# Patient Record
Sex: Male | Born: 1950 | Race: White | Hispanic: No | Marital: Married | Smoking: Current every day smoker
Health system: Southern US, Community
[De-identification: ages and names within clinical notes are randomized; demographics above are authoritative.]

## PROBLEM LIST (undated history)

## (undated) DIAGNOSIS — I10 Essential (primary) hypertension: Secondary | ICD-10-CM

## (undated) DIAGNOSIS — F419 Anxiety disorder, unspecified: Secondary | ICD-10-CM

## (undated) HISTORY — DX: Anxiety disorder, unspecified: F41.9

## (undated) HISTORY — DX: Essential (primary) hypertension: I10

## (undated) HISTORY — PX: CERVICAL SPINE SURGERY: SHX589

---

## 2002-07-13 ENCOUNTER — Ambulatory Visit (HOSPITAL_COMMUNITY): Admission: RE | Admit: 2002-07-13 | Discharge: 2002-07-13 | Payer: Self-pay | Admitting: Internal Medicine

## 2003-10-18 ENCOUNTER — Ambulatory Visit (HOSPITAL_COMMUNITY): Admission: RE | Admit: 2003-10-18 | Discharge: 2003-10-18 | Payer: Self-pay | Admitting: Family Medicine

## 2006-01-21 ENCOUNTER — Ambulatory Visit: Payer: Self-pay | Admitting: Internal Medicine

## 2010-06-27 HISTORY — PX: SPINE SURGERY: SHX786

## 2014-01-25 ENCOUNTER — Encounter: Payer: Self-pay | Admitting: Family Medicine

## 2014-02-28 ENCOUNTER — Encounter: Payer: Self-pay | Admitting: Family Medicine

## 2014-02-28 ENCOUNTER — Ambulatory Visit (INDEPENDENT_AMBULATORY_CARE_PROVIDER_SITE_OTHER): Payer: PRIVATE HEALTH INSURANCE | Admitting: Family Medicine

## 2014-02-28 VITALS — BP 106/74 | HR 82 | Temp 98.5°F | Resp 14 | Ht 72.0 in | Wt 180.0 lb

## 2014-02-28 DIAGNOSIS — F411 Generalized anxiety disorder: Secondary | ICD-10-CM

## 2014-02-28 DIAGNOSIS — K621 Rectal polyp: Secondary | ICD-10-CM

## 2014-02-28 DIAGNOSIS — K620 Anal polyp: Secondary | ICD-10-CM

## 2014-02-28 DIAGNOSIS — Z0000 Encounter for general adult medical examination without abnormal findings: Secondary | ICD-10-CM

## 2014-02-28 DIAGNOSIS — Z79899 Other long term (current) drug therapy: Secondary | ICD-10-CM

## 2014-02-28 LAB — CBC WITH DIFFERENTIAL/PLATELET
BASOS ABS: 0 10*3/uL (ref 0.0–0.1)
BASOS PCT: 0 % (ref 0–1)
EOS PCT: 3 % (ref 0–5)
Eosinophils Absolute: 0.2 10*3/uL (ref 0.0–0.7)
HEMATOCRIT: 51.7 % (ref 39.0–52.0)
HEMOGLOBIN: 18.3 g/dL — AB (ref 13.0–17.0)
Lymphocytes Relative: 19 % (ref 12–46)
Lymphs Abs: 1.3 10*3/uL (ref 0.7–4.0)
MCH: 33.7 pg (ref 26.0–34.0)
MCHC: 35.4 g/dL (ref 30.0–36.0)
MCV: 95.2 fL (ref 78.0–100.0)
MONO ABS: 0.6 10*3/uL (ref 0.1–1.0)
MONOS PCT: 9 % (ref 3–12)
Neutro Abs: 4.8 10*3/uL (ref 1.7–7.7)
Neutrophils Relative %: 69 % (ref 43–77)
Platelets: 220 10*3/uL (ref 150–400)
RBC: 5.43 MIL/uL (ref 4.22–5.81)
RDW: 14 % (ref 11.5–15.5)
WBC: 7 10*3/uL (ref 4.0–10.5)

## 2014-02-28 LAB — LIPID PANEL
CHOLESTEROL: 118 mg/dL (ref 0–200)
HDL: 50 mg/dL (ref >39)
LDL Cholesterol: 33 mg/dL (ref 0–99)
TRIGLYCERIDES: 173 mg/dL — AB (ref <150)
Total CHOL/HDL Ratio: 2.4 Ratio
VLDL: 35 mg/dL (ref 0–40)

## 2014-02-28 LAB — COMPLETE METABOLIC PANEL WITH GFR
ALBUMIN: 4.3 g/dL (ref 3.5–5.2)
ALK PHOS: 65 U/L (ref 39–117)
ALT: 45 U/L (ref 0–53)
AST: 27 U/L (ref 0–37)
BUN: 22 mg/dL (ref 6–23)
CALCIUM: 10.2 mg/dL (ref 8.4–10.5)
CO2: 25 mEq/L (ref 19–32)
CREATININE: 1.4 mg/dL — AB (ref 0.50–1.35)
Chloride: 100 mEq/L (ref 96–112)
GFR, EST NON AFRICAN AMERICAN: 53 mL/min — AB
GFR, Est African American: 62 mL/min
GLUCOSE: 97 mg/dL (ref 70–99)
Potassium: 4.5 mEq/L (ref 3.5–5.3)
Sodium: 138 mEq/L (ref 135–145)
Total Bilirubin: 0.9 mg/dL (ref 0.2–1.2)
Total Protein: 7.2 g/dL (ref 6.0–8.3)

## 2014-02-28 LAB — TSH: TSH: 1.138 u[IU]/mL (ref 0.350–4.500)

## 2014-02-28 MED ORDER — ZOLPIDEM TARTRATE 10 MG PO TABS: 10.0000 mg | ORAL_TABLET | Freq: Every evening | ORAL | Status: AC | PRN

## 2014-02-28 MED ORDER — ESCITALOPRAM OXALATE 10 MG PO TABS
10.0000 mg | ORAL_TABLET | Freq: Every day | ORAL | Status: AC
Start: 2014-02-28 — End: ?

## 2014-02-28 NOTE — Progress Notes (Signed)
Subjective:    Patient ID: Christopher Barber, male    DOB: [birth year], 63 y.o.   MRN: [id number]  HPI Patient is here today to reestablish care and complete physical exam.  He does complain of generalized anxiety. He constantly feels anxious. This causes him to have a difficult time sleeping. Patient states that this causes him to have a difficult time quitting smoking because he smokes to help control his anxiety. Otherwise he is doing well. His last colonoscopy was in 2013. Past Medical History  Diagnosis Date  . Hypertension   . Anxiety    Past Surgical History  Procedure Laterality Date  . Spine surgery  06/27/2010    neck fx  . Cervical spine surgery     Current Outpatient Prescriptions on File Prior to Visit  Medication Sig Dispense Refill  . aspirin 81 MG tablet Take 81 mg by mouth daily.      Marland Kitchen lisinopril (PRINIVIL,ZESTRIL) 20 MG tablet Take 20 mg by mouth daily.       No current facility-administered medications on file prior to visit.   No Known Allergies History   Social History  . Marital Status: Married    Spouse Name: N/A    Number of Children: N/A  . Years of Education: N/A   Occupational History  . Not on file.   Social History Main Topics  . Smoking status: Current Every Day Smoker -- 0.50 packs/day    Types: Cigarettes  . Smokeless tobacco: Never Used  . Alcohol Use: Yes     Comment: 3 drinks a week  . Drug Use: No  . Sexual Activity: Not on file     Comment: Married to [place].  Retired.   Other Topics Concern  . Not on file   Social History Narrative  . No narrative on file      Review of Systems  All other systems reviewed and are negative.      Objective:   Physical Exam  Vitals reviewed. Constitutional: He is oriented to person, place, and time. He appears well-developed and well-nourished. No distress.  HENT:  Head: Normocephalic and atraumatic.  Right Ear: External ear normal.  Left Ear: External ear normal.  Nose: Nose  normal.  Mouth/Throat: Oropharynx is clear and moist. No oropharyngeal exudate.  Eyes: Conjunctivae and EOM are normal. Pupils are equal, round, and reactive to light. Right eye exhibits no discharge. Left eye exhibits no discharge. No scleral icterus.  Neck: Normal range of motion. Neck supple. No JVD present. No tracheal deviation present. No thyromegaly present.  Cardiovascular: Normal rate, regular rhythm, normal heart sounds and intact distal pulses.  Exam reveals no gallop and no friction rub.   No murmur heard. Pulmonary/Chest: Effort normal and breath sounds normal. No stridor. No respiratory distress. He has no wheezes. He has no rales. He exhibits no tenderness.  Abdominal: Soft. Bowel sounds are normal. He exhibits no distension and no mass. There is no tenderness. There is no rebound and no guarding.  Genitourinary: Prostate normal. Rectal exam shows mass.  Musculoskeletal: Normal range of motion. He exhibits no edema and no tenderness.  Lymphadenopathy:    He has no cervical adenopathy.  Neurological: He is alert and oriented to person, place, and time. He has normal reflexes. He displays normal reflexes. No cranial nerve deficit. He exhibits normal muscle tone. Coordination normal.  Skin: Skin is warm. No rash noted. He is not diaphoretic. No erythema. No pallor.  Psychiatric: He has a normal  mood and affect. His behavior is normal. Judgment and thought content normal.    On rectal exam there is a 1 cm polyp appreciated at 6:00 approximately 5 cm internally.    Assessment & Plan:  1. Routine general medical examination at a health care facility Physical exam today is completely normal. I recommended smoking cessation. I will check a CBC, CMP, fasting lipid panel, PSA, and TSH. Blood pressure is well controlled. Immunizations are up to date. - CBC with Differential - COMPLETE METABOLIC PANEL WITH GFR - Lipid panel - PSA - TSH - Ambulatory referral to Gastroenterology  2.  Encounter for long-term (current) use of other medications - CBC with Differential - COMPLETE METABOLIC PANEL WITH GFR - Lipid panel  3. GAD (generalized anxiety disorder) Begin Lexapro 10 mg by mouth daily. Recheck in one month. He can use Ambien 5 mg by mouth each bedtime when necessary for insomnia. - escitalopram (LEXAPRO) 10 MG tablet; Take 1 tablet (10 mg total) by mouth daily.  Dispense: 30 tablet; Refill: 3  4. Rectal polyp Scheduled patient for a GI consultation. I believe this is a small rectal polyp. Can't exclude malignancy.

## 2014-03-01 ENCOUNTER — Encounter: Payer: Self-pay | Admitting: Family Medicine

## 2014-03-01 LAB — PSA: PSA: 2.87 ng/mL (ref <=4.00)

## 2014-03-08 ENCOUNTER — Telehealth: Payer: Self-pay

## 2014-03-08 NOTE — Telephone Encounter (Signed)
LMOM for a return call.  

## 2014-04-07 NOTE — Telephone Encounter (Signed)
PT has not responded to letter that was sent or phone call to schedule colonoscopy.

## 2015-05-27 ENCOUNTER — Inpatient Hospital Stay (HOSPITAL_COMMUNITY)
Admission: EM | Admit: 2015-05-27 | Discharge: 2015-06-06 | DRG: 208 | Disposition: E | Payer: Non-veteran care | Attending: Internal Medicine | Admitting: Internal Medicine

## 2015-05-27 ENCOUNTER — Encounter (HOSPITAL_COMMUNITY): Payer: Self-pay

## 2015-05-27 ENCOUNTER — Emergency Department (HOSPITAL_COMMUNITY): Payer: Non-veteran care

## 2015-05-27 DIAGNOSIS — J9600 Acute respiratory failure, unspecified whether with hypoxia or hypercapnia: Secondary | ICD-10-CM | POA: Insufficient documentation

## 2015-05-27 DIAGNOSIS — F17210 Nicotine dependence, cigarettes, uncomplicated: Secondary | ICD-10-CM | POA: Diagnosis present

## 2015-05-27 DIAGNOSIS — Z79899 Other long term (current) drug therapy: Secondary | ICD-10-CM

## 2015-05-27 DIAGNOSIS — F419 Anxiety disorder, unspecified: Secondary | ICD-10-CM | POA: Diagnosis present

## 2015-05-27 DIAGNOSIS — R739 Hyperglycemia, unspecified: Secondary | ICD-10-CM | POA: Diagnosis present

## 2015-05-27 DIAGNOSIS — Z66 Do not resuscitate: Secondary | ICD-10-CM | POA: Diagnosis not present

## 2015-05-27 DIAGNOSIS — I469 Cardiac arrest, cause unspecified: Secondary | ICD-10-CM | POA: Diagnosis not present

## 2015-05-27 DIAGNOSIS — J449 Chronic obstructive pulmonary disease, unspecified: Secondary | ICD-10-CM | POA: Diagnosis present

## 2015-05-27 DIAGNOSIS — Z515 Encounter for palliative care: Secondary | ICD-10-CM

## 2015-05-27 DIAGNOSIS — I10 Essential (primary) hypertension: Secondary | ICD-10-CM | POA: Diagnosis present

## 2015-05-27 DIAGNOSIS — G253 Myoclonus: Secondary | ICD-10-CM | POA: Diagnosis present

## 2015-05-27 DIAGNOSIS — K2901 Acute gastritis with bleeding: Secondary | ICD-10-CM | POA: Diagnosis present

## 2015-05-27 DIAGNOSIS — E874 Mixed disorder of acid-base balance: Secondary | ICD-10-CM | POA: Diagnosis present

## 2015-05-27 DIAGNOSIS — K2970 Gastritis, unspecified, without bleeding: Secondary | ICD-10-CM | POA: Diagnosis present

## 2015-05-27 DIAGNOSIS — I480 Paroxysmal atrial fibrillation: Secondary | ICD-10-CM | POA: Diagnosis present

## 2015-05-27 DIAGNOSIS — Z8249 Family history of ischemic heart disease and other diseases of the circulatory system: Secondary | ICD-10-CM

## 2015-05-27 DIAGNOSIS — N179 Acute kidney failure, unspecified: Secondary | ICD-10-CM | POA: Diagnosis present

## 2015-05-27 DIAGNOSIS — G92 Toxic encephalopathy: Secondary | ICD-10-CM | POA: Diagnosis present

## 2015-05-27 DIAGNOSIS — F1010 Alcohol abuse, uncomplicated: Secondary | ICD-10-CM | POA: Diagnosis present

## 2015-05-27 DIAGNOSIS — W1830XA Fall on same level, unspecified, initial encounter: Secondary | ICD-10-CM | POA: Diagnosis present

## 2015-05-27 DIAGNOSIS — R000 Tachycardia, unspecified: Secondary | ICD-10-CM | POA: Diagnosis present

## 2015-05-27 DIAGNOSIS — J9601 Acute respiratory failure with hypoxia: Secondary | ICD-10-CM

## 2015-05-27 DIAGNOSIS — K709 Alcoholic liver disease, unspecified: Secondary | ICD-10-CM | POA: Diagnosis present

## 2015-05-27 DIAGNOSIS — Z09 Encounter for follow-up examination after completed treatment for conditions other than malignant neoplasm: Secondary | ICD-10-CM

## 2015-05-27 DIAGNOSIS — Y908 Blood alcohol level of 240 mg/100 ml or more: Secondary | ICD-10-CM | POA: Diagnosis present

## 2015-05-27 DIAGNOSIS — R402432 Glasgow coma scale score 3-8, at arrival to emergency department: Secondary | ICD-10-CM | POA: Diagnosis present

## 2015-05-27 DIAGNOSIS — R197 Diarrhea, unspecified: Secondary | ICD-10-CM | POA: Diagnosis present

## 2015-05-27 DIAGNOSIS — R579 Shock, unspecified: Secondary | ICD-10-CM | POA: Diagnosis present

## 2015-05-27 DIAGNOSIS — D696 Thrombocytopenia, unspecified: Secondary | ICD-10-CM | POA: Diagnosis present

## 2015-05-27 DIAGNOSIS — R680 Hypothermia, not associated with low environmental temperature: Secondary | ICD-10-CM | POA: Diagnosis present

## 2015-05-27 DIAGNOSIS — S0101XA Laceration without foreign body of scalp, initial encounter: Secondary | ICD-10-CM | POA: Diagnosis present

## 2015-05-27 DIAGNOSIS — R740 Nonspecific elevation of levels of transaminase and lactic acid dehydrogenase [LDH]: Secondary | ICD-10-CM | POA: Diagnosis present

## 2015-05-27 DIAGNOSIS — F10129 Alcohol abuse with intoxication, unspecified: Secondary | ICD-10-CM | POA: Diagnosis present

## 2015-05-27 DIAGNOSIS — J9602 Acute respiratory failure with hypercapnia: Secondary | ICD-10-CM | POA: Diagnosis not present

## 2015-05-27 DIAGNOSIS — E878 Other disorders of electrolyte and fluid balance, not elsewhere classified: Secondary | ICD-10-CM | POA: Diagnosis present

## 2015-05-27 DIAGNOSIS — G931 Anoxic brain damage, not elsewhere classified: Secondary | ICD-10-CM | POA: Diagnosis present

## 2015-05-27 DIAGNOSIS — K7200 Acute and subacute hepatic failure without coma: Secondary | ICD-10-CM | POA: Diagnosis present

## 2015-05-27 DIAGNOSIS — Z7982 Long term (current) use of aspirin: Secondary | ICD-10-CM

## 2015-05-27 LAB — CBC
HCT: 41.6 % (ref 39.0–52.0)
Hemoglobin: 13.2 g/dL (ref 13.0–17.0)
MCH: 32.3 pg (ref 26.0–34.0)
MCHC: 31.7 g/dL (ref 30.0–36.0)
MCV: 101.7 fL — AB (ref 78.0–100.0)
PLATELETS: 244 10*3/uL (ref 150–400)
RBC: 4.09 MIL/uL — AB (ref 4.22–5.81)
RDW: 13.9 % (ref 11.5–15.5)
WBC: 9.8 10*3/uL (ref 4.0–10.5)

## 2015-05-27 LAB — I-STAT CG4 LACTIC ACID, ED: Lactic Acid, Venous: 9.31 mmol/L (ref 0.5–2.0)

## 2015-05-27 LAB — I-STAT TROPONIN, ED: Troponin i, poc: 0.01 ng/mL (ref 0.00–0.08)

## 2015-05-27 MED ORDER — SUCCINYLCHOLINE CHLORIDE 20 MG/ML IJ SOLN
INTRAMUSCULAR | Status: AC
Start: 2015-05-27 — End: 2015-05-28
  Filled 2015-05-27: qty 1

## 2015-05-27 MED ORDER — SODIUM CHLORIDE 0.9 % IV SOLN
INTRAVENOUS | Status: AC | PRN
  Administered 2015-05-27 (×2): 1000 mL via INTRAVENOUS

## 2015-05-27 MED ORDER — ROCURONIUM BROMIDE 50 MG/5ML IV SOLN
INTRAVENOUS | Status: AC
  Filled 2015-05-27: qty 2

## 2015-05-27 MED ORDER — LIDOCAINE HCL (CARDIAC) 20 MG/ML IV SOLN
INTRAVENOUS | Status: AC
  Filled 2015-05-27: qty 5

## 2015-05-27 MED ORDER — ETOMIDATE 2 MG/ML IV SOLN
INTRAVENOUS | Status: AC | PRN
  Administered 2015-05-27: 20 mg via INTRAVENOUS

## 2015-05-27 MED ORDER — ETOMIDATE 2 MG/ML IV SOLN
INTRAVENOUS | Status: AC
  Filled 2015-05-27: qty 20

## 2015-05-27 MED ORDER — EPINEPHRINE HCL 0.1 MG/ML IJ SOSY
PREFILLED_SYRINGE | INTRAMUSCULAR | Status: AC | PRN
  Administered 2015-05-27: 1 mg via INTRAVENOUS

## 2015-05-27 MED ORDER — ROCURONIUM BROMIDE 50 MG/5ML IV SOLN
INTRAVENOUS | Status: AC | PRN
  Administered 2015-05-27: 50 mg via INTRAVENOUS

## 2015-05-27 NOTE — ED Notes (Signed)
Pt placed on bare hugger with sheet over top due to Dr. Magdalene Mollyrder and pts temp 33.4C.

## 2015-05-27 NOTE — Code Documentation (Signed)
Pt has yellow colored necklace with cross pendant removed and placed in specimen cup.
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
## 2015-05-27 NOTE — ED Notes (Signed)
Pt returned from CT with this RN at this time.  

## 2015-05-28 ENCOUNTER — Inpatient Hospital Stay (HOSPITAL_COMMUNITY): Payer: Non-veteran care

## 2015-05-28 ENCOUNTER — Other Ambulatory Visit (HOSPITAL_COMMUNITY): Payer: Non-veteran care

## 2015-05-28 DIAGNOSIS — K2901 Acute gastritis with bleeding: Secondary | ICD-10-CM | POA: Diagnosis present

## 2015-05-28 DIAGNOSIS — R739 Hyperglycemia, unspecified: Secondary | ICD-10-CM | POA: Diagnosis present

## 2015-05-28 DIAGNOSIS — F1010 Alcohol abuse, uncomplicated: Secondary | ICD-10-CM | POA: Diagnosis present

## 2015-05-28 DIAGNOSIS — G253 Myoclonus: Secondary | ICD-10-CM

## 2015-05-28 DIAGNOSIS — Z515 Encounter for palliative care: Secondary | ICD-10-CM | POA: Diagnosis not present

## 2015-05-28 DIAGNOSIS — K7200 Acute and subacute hepatic failure without coma: Secondary | ICD-10-CM | POA: Diagnosis present

## 2015-05-28 DIAGNOSIS — D696 Thrombocytopenia, unspecified: Secondary | ICD-10-CM | POA: Diagnosis present

## 2015-05-28 DIAGNOSIS — J9602 Acute respiratory failure with hypercapnia: Secondary | ICD-10-CM | POA: Diagnosis present

## 2015-05-28 DIAGNOSIS — J449 Chronic obstructive pulmonary disease, unspecified: Secondary | ICD-10-CM | POA: Diagnosis present

## 2015-05-28 DIAGNOSIS — R000 Tachycardia, unspecified: Secondary | ICD-10-CM | POA: Diagnosis present

## 2015-05-28 DIAGNOSIS — I469 Cardiac arrest, cause unspecified: Secondary | ICD-10-CM

## 2015-05-28 DIAGNOSIS — R402432 Glasgow coma scale score 3-8, at arrival to emergency department: Secondary | ICD-10-CM | POA: Diagnosis present

## 2015-05-28 DIAGNOSIS — W1830XA Fall on same level, unspecified, initial encounter: Secondary | ICD-10-CM | POA: Diagnosis present

## 2015-05-28 DIAGNOSIS — G931 Anoxic brain damage, not elsewhere classified: Secondary | ICD-10-CM | POA: Diagnosis not present

## 2015-05-28 DIAGNOSIS — I10 Essential (primary) hypertension: Secondary | ICD-10-CM | POA: Diagnosis present

## 2015-05-28 DIAGNOSIS — R197 Diarrhea, unspecified: Secondary | ICD-10-CM | POA: Diagnosis present

## 2015-05-28 DIAGNOSIS — Z8249 Family history of ischemic heart disease and other diseases of the circulatory system: Secondary | ICD-10-CM | POA: Diagnosis not present

## 2015-05-28 DIAGNOSIS — R7989 Other specified abnormal findings of blood chemistry: Secondary | ICD-10-CM | POA: Diagnosis not present

## 2015-05-28 DIAGNOSIS — J9601 Acute respiratory failure with hypoxia: Secondary | ICD-10-CM | POA: Diagnosis not present

## 2015-05-28 DIAGNOSIS — J9600 Acute respiratory failure, unspecified whether with hypoxia or hypercapnia: Secondary | ICD-10-CM | POA: Diagnosis not present

## 2015-05-28 DIAGNOSIS — Z66 Do not resuscitate: Secondary | ICD-10-CM | POA: Diagnosis not present

## 2015-05-28 DIAGNOSIS — Z7982 Long term (current) use of aspirin: Secondary | ICD-10-CM | POA: Diagnosis not present

## 2015-05-28 DIAGNOSIS — F419 Anxiety disorder, unspecified: Secondary | ICD-10-CM | POA: Diagnosis present

## 2015-05-28 DIAGNOSIS — N179 Acute kidney failure, unspecified: Secondary | ICD-10-CM | POA: Diagnosis present

## 2015-05-28 DIAGNOSIS — K709 Alcoholic liver disease, unspecified: Secondary | ICD-10-CM | POA: Diagnosis present

## 2015-05-28 DIAGNOSIS — R579 Shock, unspecified: Secondary | ICD-10-CM | POA: Diagnosis present

## 2015-05-28 DIAGNOSIS — K2970 Gastritis, unspecified, without bleeding: Secondary | ICD-10-CM | POA: Diagnosis present

## 2015-05-28 DIAGNOSIS — G92 Toxic encephalopathy: Secondary | ICD-10-CM | POA: Diagnosis present

## 2015-05-28 DIAGNOSIS — S0101XA Laceration without foreign body of scalp, initial encounter: Secondary | ICD-10-CM | POA: Diagnosis present

## 2015-05-28 DIAGNOSIS — F17210 Nicotine dependence, cigarettes, uncomplicated: Secondary | ICD-10-CM | POA: Diagnosis present

## 2015-05-28 DIAGNOSIS — R740 Nonspecific elevation of levels of transaminase and lactic acid dehydrogenase [LDH]: Secondary | ICD-10-CM | POA: Diagnosis present

## 2015-05-28 DIAGNOSIS — E874 Mixed disorder of acid-base balance: Secondary | ICD-10-CM | POA: Diagnosis present

## 2015-05-28 DIAGNOSIS — F10129 Alcohol abuse with intoxication, unspecified: Secondary | ICD-10-CM | POA: Diagnosis present

## 2015-05-28 DIAGNOSIS — Z79899 Other long term (current) drug therapy: Secondary | ICD-10-CM | POA: Diagnosis not present

## 2015-05-28 DIAGNOSIS — E878 Other disorders of electrolyte and fluid balance, not elsewhere classified: Secondary | ICD-10-CM | POA: Diagnosis present

## 2015-05-28 DIAGNOSIS — I480 Paroxysmal atrial fibrillation: Secondary | ICD-10-CM | POA: Diagnosis present

## 2015-05-28 DIAGNOSIS — Y908 Blood alcohol level of 240 mg/100 ml or more: Secondary | ICD-10-CM | POA: Diagnosis present

## 2015-05-28 DIAGNOSIS — R680 Hypothermia, not associated with low environmental temperature: Secondary | ICD-10-CM | POA: Diagnosis present

## 2015-05-28 LAB — MAGNESIUM
MAGNESIUM: 2.7 mg/dL — AB (ref 1.7–2.4)
MAGNESIUM: 1.4 mg/dL — AB (ref 1.7–2.4)
Magnesium: 1.3 mg/dL — ABNORMAL LOW (ref 1.7–2.4)

## 2015-05-28 LAB — I-STAT ARTERIAL BLOOD GAS, ED
Acid-base deficit: 17 mmol/L — ABNORMAL HIGH (ref 0.0–2.0)
Acid-base deficit: 21 mmol/L — ABNORMAL HIGH (ref 0.0–2.0)
Bicarbonate: 12.2 meq/L — ABNORMAL LOW (ref 20.0–24.0)
Bicarbonate: 9.6 mEq/L — ABNORMAL LOW (ref 20.0–24.0)
O2 SAT: 100 %
O2 Saturation: 100 %
PH ART: 7.218 — AB (ref 7.350–7.450)
Patient temperature: 33.7
TCO2: 10 mmol/L (ref 0–100)
TCO2: 14 mmol/L (ref 0–100)
pCO2 arterial: 22.9 mmHg — ABNORMAL LOW (ref 35.0–45.0)
pCO2 arterial: 53.2 mmHg — ABNORMAL HIGH (ref 35.0–45.0)
pH, Arterial: 6.944 — CL (ref 7.350–7.450)
pO2, Arterial: 217 mmHg — ABNORMAL HIGH (ref 80.0–100.0)
pO2, Arterial: 581 mmHg — ABNORMAL HIGH (ref 80.0–100.0)

## 2015-05-28 LAB — GLUCOSE, CAPILLARY
GLUCOSE-CAPILLARY: 157 mg/dL — AB (ref 65–99)
Glucose-Capillary: 148 mg/dL — ABNORMAL HIGH (ref 65–99)
Glucose-Capillary: 119 mg/dL — ABNORMAL HIGH (ref 65–99)

## 2015-05-28 LAB — URINALYSIS, ROUTINE W REFLEX MICROSCOPIC
Bilirubin Urine: NEGATIVE
GLUCOSE, UA: NEGATIVE mg/dL
KETONES UR: NEGATIVE mg/dL
Nitrite: NEGATIVE
PROTEIN: 30 mg/dL — AB
Specific Gravity, Urine: 1.006 (ref 1.005–1.030)
Urobilinogen, UA: 0.2 mg/dL (ref 0.0–1.0)
pH: 5.5 (ref 5.0–8.0)

## 2015-05-28 LAB — CBC
HCT: 36.7 % — ABNORMAL LOW (ref 39.0–52.0)
HEMOGLOBIN: 12.3 g/dL — AB (ref 13.0–17.0)
MCH: 32.8 pg (ref 26.0–34.0)
MCHC: 33.5 g/dL (ref 30.0–36.0)
MCV: 97.9 fL (ref 78.0–100.0)
PLATELETS: 241 10*3/uL (ref 150–400)
RBC: 3.75 MIL/uL — AB (ref 4.22–5.81)
RDW: 13.9 % (ref 11.5–15.5)
WBC: 20.9 10*3/uL — AB (ref 4.0–10.5)

## 2015-05-28 LAB — COMPREHENSIVE METABOLIC PANEL
ALBUMIN: 3.5 g/dL (ref 3.5–5.0)
ALK PHOS: 74 U/L (ref 38–126)
ALT: 174 U/L — ABNORMAL HIGH (ref 17–63)
ANION GAP: 12 (ref 5–15)
AST: 309 U/L — ABNORMAL HIGH (ref 15–41)
BUN: 19 mg/dL (ref 6–20)
CALCIUM: 8.6 mg/dL — AB (ref 8.9–10.3)
CHLORIDE: 110 mmol/L (ref 101–111)
CO2: 19 mmol/L — AB (ref 22–32)
Creatinine, Ser: 1.44 mg/dL — ABNORMAL HIGH (ref 0.61–1.24)
GFR calc Af Amer: 58 mL/min — ABNORMAL LOW (ref >60)
GFR calc non Af Amer: 50 mL/min — ABNORMAL LOW (ref >60)
GLUCOSE: 166 mg/dL — AB (ref 65–99)
Potassium: 3.6 mmol/L (ref 3.5–5.1)
SODIUM: 141 mmol/L (ref 135–145)
Total Bilirubin: 0.4 mg/dL (ref 0.3–1.2)
Total Protein: 6 g/dL — ABNORMAL LOW (ref 6.5–8.1)

## 2015-05-28 LAB — POCT I-STAT 3, ART BLOOD GAS (G3+)
ACID-BASE DEFICIT: 8 mmol/L — AB (ref 0.0–2.0)
Acid-base deficit: 12 mmol/L — ABNORMAL HIGH (ref 0.0–2.0)
BICARBONATE: 15.3 meq/L — AB (ref 20.0–24.0)
Bicarbonate: 12.6 mEq/L — ABNORMAL LOW (ref 20.0–24.0)
O2 Saturation: 99 %
O2 Saturation: 100 %
PCO2 ART: 26.7 mmHg — AB (ref 35.0–45.0)
PH ART: 7.284 — AB (ref 7.350–7.450)
PO2 ART: 154 mmHg — AB (ref 80.0–100.0)
PO2 ART: 190 mmHg — AB (ref 80.0–100.0)
Patient temperature: 99
TCO2: 13 mmol/L (ref 0–100)
TCO2: 16 mmol/L (ref 0–100)
pCO2 arterial: 26.3 mmHg — ABNORMAL LOW (ref 35.0–45.0)
pH, Arterial: 7.376 (ref 7.350–7.450)

## 2015-05-28 LAB — URINE MICROSCOPIC-ADD ON

## 2015-05-28 LAB — I-STAT CHEM 8, ED
BUN: 22 mg/dL — ABNORMAL HIGH (ref 6–20)
Calcium, Ion: 1.06 mmol/L — ABNORMAL LOW (ref 1.13–1.30)
Chloride: 111 mmol/L (ref 101–111)
Creatinine, Ser: 1.5 mg/dL — ABNORMAL HIGH (ref 0.61–1.24)
Glucose, Bld: 107 mg/dL — ABNORMAL HIGH (ref 65–99)
HCT: 48 % (ref 39.0–52.0)
Hemoglobin: 16.3 g/dL (ref 13.0–17.0)
Potassium: 3 mmol/L — ABNORMAL LOW (ref 3.5–5.1)
Sodium: 146 mmol/L — ABNORMAL HIGH (ref 135–145)
TCO2: 13 mmol/L (ref 0–100)

## 2015-05-28 LAB — BLOOD GAS, ARTERIAL
Acid-base deficit: 18.9 mmol/L — ABNORMAL HIGH (ref 0.0–2.0)
BICARBONATE: 7.9 meq/L — AB (ref 20.0–24.0)
DRAWN BY: 29017
FIO2: 0.4
MECHVT: 620 mL
O2 Saturation: 97 %
PATIENT TEMPERATURE: 98.6
PCO2 ART: 22.2 mmHg — AB (ref 35.0–45.0)
PEEP: 5 cmH2O
PO2 ART: 119 mmHg — AB (ref 80.0–100.0)
RATE: 24 resp/min
TCO2: 8.6 mmol/L (ref 0–100)
pH, Arterial: 7.179 — CL (ref 7.350–7.450)

## 2015-05-28 LAB — PROTIME-INR
INR: 1.28 (ref 0.00–1.49)
PROTHROMBIN TIME: 16.2 s — AB (ref 11.6–15.2)

## 2015-05-28 LAB — ETHANOL: ALCOHOL ETHYL (B): 413 mg/dL — AB (ref <5)

## 2015-05-28 LAB — BASIC METABOLIC PANEL
ANION GAP: 18 — AB (ref 5–15)
ANION GAP: 9 (ref 5–15)
BUN: 20 mg/dL (ref 6–20)
BUN: 24 mg/dL — ABNORMAL HIGH (ref 6–20)
CALCIUM: 6.3 mg/dL — AB (ref 8.9–10.3)
CHLORIDE: 116 mmol/L — AB (ref 101–111)
CO2: 9 mmol/L — AB (ref 22–32)
CO2: 17 mmol/L — ABNORMAL LOW (ref 22–32)
Calcium: 6.9 mg/dL — ABNORMAL LOW (ref 8.9–10.3)
Chloride: 112 mmol/L — ABNORMAL HIGH (ref 101–111)
Creatinine, Ser: 1.36 mg/dL — ABNORMAL HIGH (ref 0.61–1.24)
Creatinine, Ser: 1.78 mg/dL — ABNORMAL HIGH (ref 0.61–1.24)
GFR calc Af Amer: >60 mL/min (ref >60)
GFR calc Af Amer: 45 mL/min — ABNORMAL LOW (ref >60)
GFR calc non Af Amer: 54 mL/min — ABNORMAL LOW (ref >60)
GFR, EST NON AFRICAN AMERICAN: 39 mL/min — AB (ref >60)
GLUCOSE: 179 mg/dL — AB (ref 65–99)
Glucose, Bld: 163 mg/dL — ABNORMAL HIGH (ref 65–99)
POTASSIUM: 3.9 mmol/L (ref 3.5–5.1)
Potassium: 4.3 mmol/L (ref 3.5–5.1)
SODIUM: 142 mmol/L (ref 135–145)
Sodium: 139 mmol/L (ref 135–145)

## 2015-05-28 LAB — RAPID URINE DRUG SCREEN, HOSP PERFORMED
AMPHETAMINES: NOT DETECTED
BARBITURATES: NOT DETECTED
BENZODIAZEPINES: NOT DETECTED
COCAINE: NOT DETECTED
Opiates: NOT DETECTED
TETRAHYDROCANNABINOL: NOT DETECTED

## 2015-05-28 LAB — TROPONIN I
TROPONIN I: 0.06 ng/mL — AB (ref <0.031)
TROPONIN I: 0.1 ng/mL — AB (ref <0.031)
Troponin I: 0.07 ng/mL — ABNORMAL HIGH (ref <0.031)

## 2015-05-28 LAB — LACTIC ACID, PLASMA
LACTIC ACID, VENOUS: 7.2 mmol/L — AB (ref 0.5–2.0)
LACTIC ACID, VENOUS: 5.1 mmol/L — AB (ref 0.5–2.0)
Lactic Acid, Venous: 5.6 mmol/L (ref 0.5–2.0)

## 2015-05-28 LAB — MRSA PCR SCREENING: MRSA by PCR: NEGATIVE

## 2015-05-28 LAB — OCCULT BLOOD X 1 CARD TO LAB, STOOL: FECAL OCCULT BLD: POSITIVE — AB

## 2015-05-28 LAB — PHOSPHORUS: Phosphorus: 4.3 mg/dL (ref 2.5–4.6)

## 2015-05-28 MED ORDER — SODIUM CHLORIDE 0.9 % IV SOLN
INTRAVENOUS | Status: DC
  Administered 2015-05-28 – 2015-05-29 (×2): via INTRAVENOUS

## 2015-05-28 MED ORDER — SODIUM CHLORIDE 0.9 % IV SOLN
2.0000 g | Freq: Once | INTRAVENOUS | Status: AC
  Administered 2015-05-28: 2 g via INTRAVENOUS
  Filled 2015-05-28: qty 20

## 2015-05-28 MED ORDER — SODIUM CHLORIDE 0.9 % IV BOLUS (SEPSIS)
1000.0000 mL | Freq: Once | INTRAVENOUS | Status: AC
  Administered 2015-05-28: 1000 mL via INTRAVENOUS

## 2015-05-28 MED ORDER — ONDANSETRON HCL 4 MG/2ML IJ SOLN: 4.0000 mg | Freq: Four times a day (QID) | INTRAMUSCULAR | Status: DC | PRN

## 2015-05-28 MED ORDER — NOREPINEPHRINE BITARTRATE 1 MG/ML IV SOLN
0.0000 ug/min | Freq: Once | INTRAVENOUS | Status: AC
  Administered 2015-05-28: 15 ug/min via INTRAVENOUS
  Filled 2015-05-28: qty 4

## 2015-05-28 MED ORDER — SODIUM CHLORIDE 0.9 % IV SOLN
500.0000 mg | Freq: Two times a day (BID) | INTRAVENOUS | Status: DC
  Administered 2015-05-28: 500 mg via INTRAVENOUS
  Filled 2015-05-28 (×2): qty 5

## 2015-05-28 MED ORDER — ACETAMINOPHEN 325 MG PO TABS: 650.0000 mg | ORAL_TABLET | ORAL | Status: DC | PRN

## 2015-05-28 MED ORDER — NOREPINEPHRINE BITARTRATE 1 MG/ML IV SOLN
0.0000 ug/min | Freq: Once | INTRAVENOUS | Status: AC
  Administered 2015-05-28: 40 ug/min via INTRAVENOUS
  Filled 2015-05-28: qty 4

## 2015-05-28 MED ORDER — INFLUENZA VAC SPLIT QUAD 0.5 ML IM SUSY
0.5000 mL | PREFILLED_SYRINGE | INTRAMUSCULAR | Status: AC
  Administered 2015-05-29: 0.5 mL via INTRAMUSCULAR
  Filled 2015-05-28: qty 0.5

## 2015-05-28 MED ORDER — SODIUM CHLORIDE 0.9 % IV SOLN
1000.0000 mg | Freq: Two times a day (BID) | INTRAVENOUS | Status: DC
  Administered 2015-05-28 – 2015-05-31 (×6): 1000 mg via INTRAVENOUS
  Filled 2015-05-28 (×7): qty 10

## 2015-05-28 MED ORDER — MAGNESIUM SULFATE 2 GM/50ML IV SOLN
2.0000 g | Freq: Once | INTRAVENOUS | Status: AC
  Administered 2015-05-28: 2 g via INTRAVENOUS
  Filled 2015-05-28: qty 50

## 2015-05-28 MED ORDER — SODIUM CHLORIDE 0.9 % IV SOLN
0.0000 ug/h | INTRAVENOUS | Status: DC
  Administered 2015-05-28: 25 ug/h via INTRAVENOUS
  Filled 2015-05-28: qty 50

## 2015-05-28 MED ORDER — CHLORHEXIDINE GLUCONATE 0.12% ORAL RINSE (MEDLINE KIT)
15.0000 mL | Freq: Two times a day (BID) | OROMUCOSAL | Status: DC
  Administered 2015-05-28 – 2015-05-31 (×7): 15 mL via OROMUCOSAL

## 2015-05-28 MED ORDER — SODIUM CHLORIDE 0.9 % IV BOLUS (SEPSIS)
2000.0000 mL | Freq: Once | INTRAVENOUS | Status: AC
  Administered 2015-05-28: 2000 mL via INTRAVENOUS

## 2015-05-28 MED ORDER — PANTOPRAZOLE SODIUM 40 MG PO PACK
40.0000 mg | PACK | Freq: Every day | ORAL | Status: DC
  Administered 2015-05-28 – 2015-05-30 (×3): 40 mg
  Filled 2015-05-28 (×2): qty 20

## 2015-05-28 MED ORDER — FAMOTIDINE IN NACL 20-0.9 MG/50ML-% IV SOLN
20.0000 mg | Freq: Two times a day (BID) | INTRAVENOUS | Status: DC
  Administered 2015-05-28 – 2015-05-31 (×7): 20 mg via INTRAVENOUS
  Filled 2015-05-28 (×7): qty 50

## 2015-05-28 MED ORDER — SODIUM BICARBONATE 8.4 % IV SOLN
50.0000 meq | Freq: Once | INTRAVENOUS | Status: AC
  Administered 2015-05-28: 50 meq via INTRAVENOUS

## 2015-05-28 MED ORDER — LACTATED RINGERS IV SOLN
INTRAVENOUS | Status: DC
  Administered 2015-05-28: 05:00:00 via INTRAVENOUS

## 2015-05-28 MED ORDER — ENOXAPARIN SODIUM 40 MG/0.4ML ~~LOC~~ SOLN
40.0000 mg | SUBCUTANEOUS | Status: DC
  Administered 2015-05-28: 40 mg via SUBCUTANEOUS
  Filled 2015-05-28: qty 0.4

## 2015-05-28 MED ORDER — INSULIN ASPART 100 UNIT/ML ~~LOC~~ SOLN
0.0000 [IU] | SUBCUTANEOUS | Status: DC
  Administered 2015-05-28: 1 [IU] via SUBCUTANEOUS
  Administered 2015-05-28: 2 [IU] via SUBCUTANEOUS
  Administered 2015-05-29 (×3): 1 [IU] via SUBCUTANEOUS

## 2015-05-28 MED ORDER — FENTANYL CITRATE (PF) 100 MCG/2ML IJ SOLN
25.0000 ug | INTRAMUSCULAR | Status: DC | PRN
  Administered 2015-05-28: 100 ug via INTRAVENOUS
  Filled 2015-05-28: qty 2

## 2015-05-28 MED ORDER — ASPIRIN 300 MG RE SUPP
300.0000 mg | RECTAL | Status: AC
  Administered 2015-05-28: 300 mg via RECTAL
  Filled 2015-05-28: qty 1

## 2015-05-28 MED ORDER — ASPIRIN 81 MG PO CHEW: 324.0000 mg | CHEWABLE_TABLET | ORAL | Status: AC

## 2015-05-28 MED ORDER — SODIUM CHLORIDE 0.9 % IV SOLN: 250.0000 mL | INTRAVENOUS | Status: DC | PRN

## 2015-05-28 MED ORDER — ALBUTEROL SULFATE (2.5 MG/3ML) 0.083% IN NEBU: 2.5000 mg | INHALATION_SOLUTION | RESPIRATORY_TRACT | Status: DC | PRN

## 2015-05-28 MED ORDER — NOREPINEPHRINE BITARTRATE 1 MG/ML IV SOLN
2.0000 ug/min | INTRAVENOUS | Status: DC
  Administered 2015-05-28: 40 ug/min via INTRAVENOUS
  Administered 2015-05-30: 10 ug/min via INTRAVENOUS
  Administered 2015-05-30: 2 ug/min via INTRAVENOUS
  Filled 2015-05-28 (×5): qty 16

## 2015-05-28 MED ORDER — SODIUM BICARBONATE 8.4 % IV SOLN
100.0000 meq | Freq: Once | INTRAVENOUS | Status: AC
  Administered 2015-05-28: 100 meq via INTRAVENOUS
  Filled 2015-05-28: qty 50

## 2015-05-28 MED ORDER — VALPROATE SODIUM 500 MG/5ML IV SOLN
500.0000 mg | Freq: Three times a day (TID) | INTRAVENOUS | Status: DC
  Administered 2015-05-28 – 2015-05-31 (×8): 500 mg via INTRAVENOUS
  Filled 2015-05-28 (×11): qty 5

## 2015-05-28 MED ORDER — VALPROATE SODIUM 500 MG/5ML IV SOLN
15.0000 mg/kg | Freq: Once | INTRAVENOUS | Status: AC
  Administered 2015-05-28: 1247 mg via INTRAVENOUS
  Filled 2015-05-28: qty 12.47

## 2015-05-28 MED ORDER — LORAZEPAM 2 MG/ML IJ SOLN
1.0000 mg | INTRAMUSCULAR | Status: DC | PRN
Start: 2015-05-28 — End: 2015-05-31
  Administered 2015-05-28 – 2015-05-29 (×3): 2 mg via INTRAVENOUS
  Filled 2015-05-28 (×3): qty 1

## 2015-05-28 MED ORDER — ANTISEPTIC ORAL RINSE SOLUTION (CORINZ)
7.0000 mL | OROMUCOSAL | Status: DC
  Administered 2015-05-28 – 2015-05-31 (×31): 7 mL via OROMUCOSAL

## 2015-05-28 NOTE — Progress Notes (Signed)
Paged MD regarding low urine output, orders received.
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
## 2015-05-28 NOTE — ED Notes (Signed)
Family at bedside. Dr. Celene KrasBrooten in to speak with family.

## 2015-05-28 NOTE — Progress Notes (Signed)
Dr. Marchelle Gearingamaswamy notified of patient with intermittent rhythmic eye movements, suspicious for seizure activity. Order given and placed for Keppra IVPB. Continuing to monitor patient at this time.

## 2015-05-28 NOTE — Progress Notes (Signed)
eLink Physician-Brief Progress Note Patient Name: Katina DegreeMichael A Boldon DOB: [birth year] MRN: [id number]   Date of Service  05/28/2015  HPI/Events of Note  Multiple issues: 1. Ca++ = 6.3 and albumin = 3.5. ABG = 7.179/22/119/   eICU Interventions  Will order: 1. Replete Ca++ 2. NaHCO3 100 meq IV now. 3. ABG and lactic acid level at 7 AM.      Intervention Category Major Interventions: Acid-Base disturbance - evaluation and management Intermediate Interventions: Electrolyte abnormality - evaluation and management  Inayah Woodin Eugene 05/28/2015, 5:49 AM
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
## 2015-05-28 NOTE — ED Notes (Signed)
This Clinical research associatewriter to assist in room during placement of Central and Arterial line by Dr. Celene KrasBrooten.

## 2015-05-28 NOTE — Progress Notes (Signed)
Patient seen. Unresponsive on vent. In NSR without arrhythmia. BP stable. Ecg and Echo pending today. Mild troponin elevation is consistent with CPR. Otherwise per Dr. Skipper ClicheBalfour's note I agree that this was not a primary event. Will follow up on cardiac studies tomorrow.   Christopher Urbanek [place] MD, [facility]

## 2015-05-28 NOTE — Consult Note (Signed)
Cardiology Consult Note Jenna Luo TOM, MD Referring provider: Dr. Audie Pinto, ED  Reason for consult: s/p PEA arrest, witnessed head trauma  History of Present Illness (and review of medical records): NYLEN CREQUE is a 64 y.o. male with hx of HTN, anxiety, tobacco and likely alcohol abuse who presents to ED after witnessed head trauma and arrest.  He was at home with his wife who reportedly he had been drinking heavily (approx 20 bottles of alcohol).  He stood up to go inside house and fell backwards hitting his head.  Patient was not breathing and unarousable per his wife.  She called EMS and was instructed to initiate CPR. Upon EMS arrival pt did not have spontaneous respirations. After airway placed, pt was hypotensive and in afib with RVR.  He reported lost pulses on route. Had ROSC after 3 epi's and returned to [facility].  He remains with low normal BP and SR in ED.  Initial pertinent labs in ED are Cr 1.44, LFTs elevated, Etoh 413, lactic acid 9.3, ph 6.94 on ABG.  CT head with soft tissue laceration along posterior neck.  Review of Systems Review of systems not obtained due to patient factors.  There are no active problems to display for this patient.  Past Medical History  Diagnosis Date  . Hypertension   . Anxiety     Past Surgical History  Procedure Laterality Date  . Spine surgery  06/27/2010    neck fx  . Cervical spine surgery     Current Facility-Administered Medications  Medication Dose Route Frequency Provider Last Rate Last Dose  . lidocaine (cardiac) 100 mg/3m (XYLOCAINE) 20 MG/ML injection 2%           . norepinephrine (LEVOPHED) 4 mg in dextrose 5 % 250 mL (0.016 mg/mL) infusion  0-40 mcg/min Intravenous Once JHoyle Sauer MD      . sodium bicarbonate injection 50 mEq  50 mEq Intravenous Once JHoyle Sauer MD      . succinylcholine (ANECTINE) 20 MG/ML injection            Current Outpatient Prescriptions  Medication Sig Dispense Refill  . aspirin 81 MG tablet  Take 81 mg by mouth daily.    .Marland Kitchenlisinopril (PRINIVIL,ZESTRIL) 20 MG tablet Take 20 mg by mouth daily.    .Marland Kitchenescitalopram (LEXAPRO) 10 MG tablet Take 1 tablet (10 mg total) by mouth daily. (Patient not taking: Reported on 05/28/2015) 30 tablet 3  . zolpidem (AMBIEN) 10 MG tablet Take 1 tablet (10 mg total) by mouth at bedtime as needed for sleep. 15 tablet 1     No Known Allergies  Social History  Substance Use Topics  . Smoking status: Current Every Day Smoker -- 0.50 packs/day    Types: Cigarettes  . Smokeless tobacco: Never Used  . Alcohol Use: Yes     Comment: 3 drinks a week    Family History  Problem Relation Age of Onset  . Heart disease Mother      Objective:  Patient Vitals for the past 8 hrs:  BP Temp Pulse Resp SpO2 Weight  05/28/15 0030 104/57 mmHg (!) 91 F (32.8 C) 88 22 100 % -  05/28/15 0015 (!) 64/44 mmHg (!) 91.2 F (32.9 C) 92 22 100 % -  05/28/15 0012 (!) 59/37 mmHg (!) 91.2 F (32.9 C) 92 22 100 % -  05/28/15 0011 (!) 61/43 mmHg (!) 91.2 F (32.9 C) 92 22 100 % -  05/28/15 0000 (!) 85/60 mmHg (Marland Kitchen  91.6 F (33.1 C) 95 16 100 % -  05/09/2015 2345 106/72 mmHg (!) 92.7 F (33.7 C) 94 18 100 % -  05/24/2015 2317 101/64 mmHg (!) 93.4 F (34.1 C) 79 17 100 % -  05/12/2015 2315 92/59 mmHg (!) 93 F (33.9 C) 83 16 100 % -  05/30/2015 2309 128/68 mmHg - 105 16 100 % -  05/12/2015 2308 128/68 mmHg - (!) 122 16 100 % -  05/09/2015 2257 (!) 71/38 mmHg - - 19 99 % -  05/10/2015 2250 130/70 mmHg - (!) 140 14 100 % 70 kg (154 lb 5.2 oz)  05/16/2015 2244 130/70 mmHg - (!) 140 - - -   General appearance: covered with bear hugger in trauma C HEENT: ETT Neck: cervical collar Lungs: clear to auscultation bilaterally Heart: regular rate and rhythm,  Abdomen: soft, non-distended;  Extremities: no edema Pulses: 2+ Neurologic: sedated  Results for orders placed or performed during the hospital encounter of 05/09/2015 (from the past 48 hour(s))  Urinalysis, Routine w reflex microscopic  (not at [facility])     Status: Abnormal   Collection Time: 05/14/2015 10:15 PM  Result Value Ref Range   Color, Urine YELLOW YELLOW   APPearance CLOUDY (A) CLEAR   Specific Gravity, Urine 1.006 1.005 - 1.030   pH 5.5 5.0 - 8.0   Glucose, UA NEGATIVE NEGATIVE mg/dL   Hgb urine dipstick MODERATE (A) NEGATIVE   Bilirubin Urine NEGATIVE NEGATIVE   Ketones, ur NEGATIVE NEGATIVE mg/dL   Protein, ur 30 (A) NEGATIVE mg/dL   Urobilinogen, UA 0.2 0.0 - 1.0 mg/dL   Nitrite NEGATIVE NEGATIVE   Leukocytes, UA TRACE (A) NEGATIVE  Urine rapid drug screen (hosp performed)     Status: None   Collection Time: 05/09/2015 10:15 PM  Result Value Ref Range   Opiates NONE DETECTED NONE DETECTED   Cocaine NONE DETECTED NONE DETECTED   Benzodiazepines NONE DETECTED NONE DETECTED   Amphetamines NONE DETECTED NONE DETECTED   Tetrahydrocannabinol NONE DETECTED NONE DETECTED   Barbiturates NONE DETECTED NONE DETECTED    Comment:        DRUG SCREEN FOR MEDICAL PURPOSES ONLY.  IF CONFIRMATION IS NEEDED FOR ANY PURPOSE, NOTIFY LAB WITHIN 5 DAYS.        LOWEST DETECTABLE LIMITS FOR URINE DRUG SCREEN Drug Class       Cutoff (ng/mL) Amphetamine      1000 Barbiturate      200 Benzodiazepine   660 Tricyclics       630 Opiates          300 Cocaine          300 THC              50   Urine microscopic-add on     Status: Abnormal   Collection Time: 06/01/2015 10:15 PM  Result Value Ref Range   Squamous Epithelial / LPF FEW (A) RARE   WBC, UA 0-2 <3 WBC/hpf   RBC / HPF 7-10 <3 RBC/hpf   Bacteria, UA FEW (A) RARE   Urine-Other MUCOUS PRESENT     Comment: AMORPHOUS URATES/PHOSPHATES  Ethanol     Status: Abnormal   Collection Time: 05/13/2015 10:45 PM  Result Value Ref Range   Alcohol, Ethyl (B) 413 (HH) <5 mg/dL    Comment:        LOWEST DETECTABLE LIMIT FOR SERUM ALCOHOL IS 5 mg/dL FOR MEDICAL PURPOSES ONLY CRITICAL RESULT CALLED TO, READ BACK BY AND VERIFIED WITH: Romie Levee  RN 06/04/2015 0007 JORDANS   CBC      Status: Abnormal   Collection Time: 05/08/2015 10:45 PM  Result Value Ref Range   WBC 9.8 4.0 - 10.5 K/uL   RBC 4.09 (L) 4.22 - 5.81 MIL/uL   Hemoglobin 13.2 13.0 - 17.0 g/dL   HCT 41.6 39.0 - 52.0 %   MCV 101.7 (H) 78.0 - 100.0 fL   MCH 32.3 26.0 - 34.0 pg   MCHC 31.7 30.0 - 36.0 g/dL   RDW 13.9 11.5 - 15.5 %   Platelets 244 150 - 400 K/uL  Comprehensive metabolic panel     Status: Abnormal   Collection Time: 05/09/2015 10:45 PM  Result Value Ref Range   Sodium 141 135 - 145 mmol/L   Potassium 3.6 3.5 - 5.1 mmol/L   Chloride 110 101 - 111 mmol/L   CO2 19 (L) 22 - 32 mmol/L   Glucose, Bld 166 (H) 65 - 99 mg/dL   BUN 19 6 - 20 mg/dL   Creatinine, Ser 1.44 (H) 0.61 - 1.24 mg/dL   Calcium 8.6 (L) 8.9 - 10.3 mg/dL   Total Protein 6.0 (L) 6.5 - 8.1 g/dL   Albumin 3.5 3.5 - 5.0 g/dL   AST 309 (H) 15 - 41 U/L   ALT 174 (H) 17 - 63 U/L   Alkaline Phosphatase 74 38 - 126 U/L   Total Bilirubin 0.4 0.3 - 1.2 mg/dL   GFR calc non Af Amer 50 (L) >60 mL/min   GFR calc Af Amer 58 (L) >60 mL/min    Comment: (NOTE) The eGFR has been calculated using the CKD EPI equation. This calculation has not been validated in all clinical situations. eGFR's persistently <60 mL/min signify possible Chronic Kidney Disease.    Anion gap 12 5 - 15  Magnesium     Status: Abnormal   Collection Time: 06/04/2015 10:45 PM  Result Value Ref Range   Magnesium 2.7 (H) 1.7 - 2.4 mg/dL  I-Stat Troponin, ED (not at [facility])     Status: None   Collection Time: 06/01/2015 11:02 PM  Result Value Ref Range   Troponin i, poc 0.01 0.00 - 0.08 ng/mL   Comment 3            Comment: Due to the release kinetics of cTnI, a negative result within the first hours of the onset of symptoms does not rule out myocardial infarction with certainty. If myocardial infarction is still suspected, repeat the test at appropriate intervals.   I-Stat CG4 Lactic Acid, ED     Status: Abnormal   Collection Time: 05/28/2015 11:04 PM  Result Value  Ref Range   Lactic Acid, Venous 9.31 (HH) 0.5 - 2.0 mmol/L   Comment NOTIFIED PHYSICIAN   I-Stat Arterial Blood Gas, ED - (order at [facility] and [facility] only)     Status: Abnormal   Collection Time: 05/13/2015 11:57 PM  Result Value Ref Range   pH, Arterial 6.944 (LL) 7.350 - 7.450   pCO2 arterial 53.2 (H) 35.0 - 45.0 mmHg   pO2, Arterial 581.0 (H) 80.0 - 100.0 mmHg   Bicarbonate 12.2 (L) 20.0 - 24.0 mEq/L   TCO2 14 0 - 100 mmol/L   O2 Saturation 100.0 %   Acid-base deficit 21.0 (H) 0.0 - 2.0 mmol/L   Patient temperature 33.7 C    Collection site RADIAL, ALLEN'S TEST ACCEPTABLE    Drawn by RT    Sample type ARTERIAL    Comment NOTIFIED PHYSICIAN  I-Stat Chem 8, ED     Status: Abnormal   Collection Time: 05/28/15 12:04 AM  Result Value Ref Range   Sodium 146 (H) 135 - 145 mmol/L   Potassium 3.0 (L) 3.5 - 5.1 mmol/L   Chloride 111 101 - 111 mmol/L   BUN 22 (H) 6 - 20 mg/dL   Creatinine, Ser 1.50 (H) 0.61 - 1.24 mg/dL   Glucose, Bld 107 (H) 65 - 99 mg/dL   Calcium, Ion 1.06 (L) 1.13 - 1.30 mmol/L   TCO2 13 0 - 100 mmol/L   Hemoglobin 16.3 13.0 - 17.0 g/dL   HCT 48.0 39.0 - 52.0 %   Ct Head Wo Contrast  05/30/2015  CLINICAL DATA:  Patient found unresponsive. Laceration to the back of the head. Initial encounter. EXAM: CT HEAD WITHOUT CONTRAST CT CERVICAL SPINE WITHOUT CONTRAST TECHNIQUE: Multidetector CT imaging of the head and cervical spine was performed following the standard protocol without intravenous contrast. Multiplanar CT image reconstructions of the cervical spine were also generated. COMPARISON:  None. FINDINGS: CT HEAD FINDINGS There is no evidence of acute infarction, mass lesion, or intra- or extra-axial hemorrhage on CT. Mild periventricular white matter change likely reflects small vessel ischemic microangiopathy. The posterior fossa, including the cerebellum, brainstem and fourth ventricle, is within normal limits. The third and lateral ventricles, and basal ganglia are  unremarkable in appearance. The cerebral hemispheres are symmetric in appearance, with normal gray-white differentiation. No mass effect or midline shift is seen. There is no evidence of fracture; visualized osseous structures are unremarkable in appearance. The visualized portions of the orbits are within normal limits. There is minimal partial opacification of the right maxillary sinus. The remaining paranasal sinuses and mastoid air cells are well-aerated. No significant soft tissue abnormalities are seen. CT CERVICAL SPINE FINDINGS There is no evidence of fracture or subluxation. The patient is status post anterior cervical spinal fusion at C5-C7, with mild underlying degenerative change. Vertebral bodies demonstrate normal height and alignment. Prevertebral soft tissues are not well assessed due to the patient's endotracheal tube. The thyroid gland is unremarkable in appearance. The visualized lung apices are clear. A soft tissue laceration is noted along the posterior upper neck, with associated soft tissue air. IMPRESSION: 1. No evidence of traumatic intracranial injury or fracture. 2. No evidence of fracture or subluxation along the cervical spine. 3. Soft tissue laceration along the posterior upper neck, with associated soft tissue air. 4. Mild small vessel ischemic microangiopathy. 5. Minimal partial opacification of the right maxillary sinus. 6. Status post anterior cervical spinal fusion at C5-C7. Electronically Signed   By: Garald Balding M.D.   On: 05/12/2015 23:53   Ct Cervical Spine Wo Contrast  06/02/2015  CLINICAL DATA:  Patient found unresponsive. Laceration to the back of the head. Initial encounter. EXAM: CT HEAD WITHOUT CONTRAST CT CERVICAL SPINE WITHOUT CONTRAST TECHNIQUE: Multidetector CT imaging of the head and cervical spine was performed following the standard protocol without intravenous contrast. Multiplanar CT image reconstructions of the cervical spine were also generated.  COMPARISON:  None. FINDINGS: CT HEAD FINDINGS There is no evidence of acute infarction, mass lesion, or intra- or extra-axial hemorrhage on CT. Mild periventricular white matter change likely reflects small vessel ischemic microangiopathy. The posterior fossa, including the cerebellum, brainstem and fourth ventricle, is within normal limits. The third and lateral ventricles, and basal ganglia are unremarkable in appearance. The cerebral hemispheres are symmetric in appearance, with normal gray-white differentiation. No mass effect or midline shift is seen.  There is no evidence of fracture; visualized osseous structures are unremarkable in appearance. The visualized portions of the orbits are within normal limits. There is minimal partial opacification of the right maxillary sinus. The remaining paranasal sinuses and mastoid air cells are well-aerated. No significant soft tissue abnormalities are seen. CT CERVICAL SPINE FINDINGS There is no evidence of fracture or subluxation. The patient is status post anterior cervical spinal fusion at C5-C7, with mild underlying degenerative change. Vertebral bodies demonstrate normal height and alignment. Prevertebral soft tissues are not well assessed due to the patient's endotracheal tube. The thyroid gland is unremarkable in appearance. The visualized lung apices are clear. A soft tissue laceration is noted along the posterior upper neck, with associated soft tissue air. IMPRESSION: 1. No evidence of traumatic intracranial injury or fracture. 2. No evidence of fracture or subluxation along the cervical spine. 3. Soft tissue laceration along the posterior upper neck, with associated soft tissue air. 4. Mild small vessel ischemic microangiopathy. 5. Minimal partial opacification of the right maxillary sinus. 6. Status post anterior cervical spinal fusion at C5-C7. Electronically Signed   By: Garald Balding M.D.   On: 05/08/2015 23:53   Dg Chest Portable 1 View  05/17/2015   CLINICAL DATA:  Intubation. EXAM: PORTABLE CHEST 1 VIEW COMPARISON:  Earlier this evening at 2310 hour. FINDINGS: Endotracheal tube is approximately 4 cm from the carina. Enteric tube in place, tip below the diaphragm not included in the field of view. Lung volumes are low. Left costophrenic angle is excluded from the field of view. There is bibasilar atelectasis. Cardiomediastinal contours are unchanged. No pneumothorax. No acute osseous abnormalities are seen. Artifact projects over the left shoulder. IMPRESSION: 1. Endotracheal tube 4 cm from the carina.  Enteric tube in place. 2. Low lung volumes with bibasilar atelectasis. Electronically Signed   By: Jeb Levering M.D.   On: 05/11/2015 23:49   Dg Chest Portable 1 View  05/22/2015  CLINICAL DATA:  64 year old male with respiratory distress. Intubated. EXAM: PORTABLE CHEST 1 VIEW COMPARISON:  10/18/2003 FINDINGS: The cardiomediastinal silhouette is unremarkable. An endotracheal tube is present with tip 2 cm above the carina. An NG tube is present coiled in the proximal stomach. Mild elevation of right hemidiaphragm is again noted. There is no evidence of focal airspace disease, pulmonary edema, suspicious pulmonary nodule/mass, pleural effusion, or pneumothorax. No acute bony abnormalities are identified. IMPRESSION: Support apparatus as described. No evidence of acute cardiopulmonary disease. Electronically Signed   By: Margarette Canada M.D.   On: 05/08/2015 23:43    ECG:  HR 118 atrial fibrillation, non specific IVCD,.  Now SR HR in 80s-90s on monitor.  Impression: PEA arrest Respiratory arrest Acute hypercarbic respiratory failure Lactic acidosis Ground level fall with Head trauma Paroxysmal atrial fibrillation Alcohol intoxication Elevated LFTs CKD  Recommendations: -Given history from patient wife who witnessed event, likely had respiratory arrest 2/2 head trauma after GLF due to alcohol intoxication.  Do not suspect cardiac etiology to  events.  No signs on EKG to suggest acute coronary syndrome requiring urgent and/or invasive ischemic evaluation. -Agree with volume resuscitation and treatment of acidosis to decreased likelihood further cardiac events. -Consider cycle enzymes -TTE in am assess LV function post arrest -Monitor on telemetry, EKG prn -Mech Vent as per CCM -Discussed with ED physician. -Discussed with family wife and son at bedside.  Thank you for this consult.  We will follow along with primary team and make further recommendations as indicated.

## 2015-05-28 NOTE — Progress Notes (Signed)
eLink Physician-Brief Progress Note Patient Name: Christopher DegreeMichael A Barber DOB: 1951-05-14 MRN: [id number]   Date of Service  05/28/2015  HPI/Events of Note  Mg++ = 1.3 and Creatinine = 1.36.  eICU Interventions  Will replete Mg++.     Intervention Category Intermediate Interventions: Electrolyte abnormality - evaluation and management  [name] 05/28/2015, 6:36 AM

## 2015-05-28 NOTE — Progress Notes (Signed)
eLink Physician-Brief Progress Note Patient Name: Christopher DegreeMichael A Graef DOB: 06/14/1951 MRN: [id number]   Date of Service  05/28/2015  HPI/Events of Note  Ventilator dyssynchrony.  eICU Interventions  Will order Fentanyl IV bolus and IV infusion. Titrate to RASS 0 to -1.     Intervention Category Minor Interventions: Agitation / anxiety - evaluation and management  Christopher Barber 05/28/2015, 4:40 AM

## 2015-05-28 NOTE — Progress Notes (Signed)
CRITICAL VALUE ALERT  Critical value received:  Calcium of 6.3  Date of notification:  05/28/15  Time of notification:  0545  Critical value read back:yes  Nurse who received alert:  Lily Peerhomas Kimla Furth  MD notified (1st page):  Loletha GrayerSteven Sommer  Time MD responded: [phone]

## 2015-05-28 NOTE — Progress Notes (Signed)
eLink Physician-Brief Progress Note Patient Name: Christopher Barber DOB: 1950/08/29 MRN: [id number]   Date of Service  05/28/2015  HPI/Events of Note  Multiple issues: 1. Need order for Norepinephrine IV infusion that the patient is on and 2. CVP = 5-6.   eICU Interventions  Will order: 1. Norepinephrine IV infusion. Titrate to MAP >= 65.  2. Bolus with 0.9 NaCl 1 liter IV over 1 hour now.     Intervention Category Major Interventions: Hypotension - evaluation and management;Hypovolemia - evaluation and treatment with fluids  Sommer,Steven Eugene 05/28/2015, 4:29 AM

## 2015-05-28 NOTE — ED Provider Notes (Signed)
CSN: [id number]     Arrival date & time 05/15/2015  2241 History   First MD Initiated Contact with Patient 05/29/2015 2311     Chief Complaint  Patient presents with  . Cardiac Arrest   HISTORY, ROS, AND PHYSICAL EXAM LIMITED BY CRITICAL CONDITION OF PATIENT  (Consider location/radiation/quality/duration/timing/severity/associated sxs/prior Treatment) Patient is a 64 y.o. male presenting with syncope. The history is provided by the EMS personnel.  Loss of Consciousness Episode history:  Single Most recent episode:  Today Timing:  Constant Progression:  Unchanged Chronicity:  New Context comment:  Cardiac arrest, ETOH intoxication, Fall Witnessed: yes   Associated symptoms: shortness of breath   Associated symptoms comment:  Cardiac arrest, respiratory arrest, unresponsiveness  Risk factors comment:  ETOH abuse   Past Medical History  Diagnosis Date  . Hypertension   . Anxiety    Past Surgical History  Procedure Laterality Date  . Spine surgery  06/27/2010    neck fx  . Cervical spine surgery     Family History  Problem Relation Age of Onset  . Heart disease Mother    Social History  Substance Use Topics  . Smoking status: Current Every Day Smoker -- 0.50 packs/day    Types: Cigarettes  . Smokeless tobacco: Never Used  . Alcohol Use: Yes     Comment: 3 drinks a week    Review of Systems  Unable to perform ROS: Patient unresponsive  Respiratory: Positive for shortness of breath.   Cardiovascular: Positive for syncope.      Allergies  Review of patient's allergies indicates no known allergies.  Home Medications   Prior to Admission medications   Medication Sig Start Date End Date Taking? Authorizing Provider  aspirin 81 MG tablet Take 81 mg by mouth daily.   Yes Historical Provider, MD  lisinopril (PRINIVIL,ZESTRIL) 20 MG tablet Take 20 mg by mouth daily.   Yes Historical Provider, MD  escitalopram (LEXAPRO) 10 MG tablet Take 1 tablet (10 mg total) by mouth  daily. Patient not taking: Reported on 05/28/2015 02/28/14   Donita Brooks, MD  zolpidem (AMBIEN) 10 MG tablet Take 1 tablet (10 mg total) by mouth at bedtime as needed for sleep. 02/28/14 03/30/14  Donita Brooks, MD   BP 59/37 mmHg  Pulse 92  Temp(Src) 91.2 F (32.9 C)  Resp 22  Wt 154 lb 5.2 oz (70 kg)  SpO2 100% Physical Exam  Constitutional: He appears well-developed and well-nourished. He appears distressed.  HENT:  Head: Normocephalic.  Eyes: Right eye exhibits no discharge. Left eye exhibits no discharge. No scleral icterus. Right pupil is not reactive. Left pupil is not reactive.  Neck: No JVD present. No tracheal deviation present. No thyromegaly present.  Cardiovascular: An irregularly irregular rhythm present. Tachycardia present.  Exam reveals decreased pulses.   Pulses:      Carotid pulses are 2+ on the right side, and 2+ on the left side.      Femoral pulses are 2+ on the right side, and 2+ on the left side. Pulmonary/Chest: Apnea noted. He is in respiratory distress.  Abdominal: He exhibits distension.  Lymphadenopathy:    He has no cervical adenopathy.  Neurological: He is unresponsive. He displays no tremor. He displays no seizure activity. GCS eye subscore is 1. GCS verbal subscore is 1. GCS motor subscore is 1.  Skin: He is diaphoretic.    ED Course  .Intubation Date/Time: 05/23/2015 10:55 PM Performed by: Gavin Pound Authorized by: Nelva Nay Consent: The  procedure was performed in an emergent situation. Required items: required blood products, implants, devices, and special equipment available Patient identity confirmed: provided demographic data Indications: respiratory failure Intubation method: video-assisted Patient status: unconscious Preoxygenation: BVM Sedatives: etomidate Paralytic: rocuronium Laryngoscope size: Glidescope D. Tube size: 7.5 mm Tube type: cuffed Number of attempts: 2 Ventilation between attempts: BVM Cords  visualized: yes Post-procedure assessment: chest rise,  ETCO2 monitor and CO2 detector Breath sounds: equal and absent over the epigastrium Cuff inflated: yes ETT to lip: 26 cm Tube secured with: ETT holder Chest x-ray interpreted by radiologist and me. Chest x-ray findings: endotracheal tube too low Tube repositioned: tube repositioned successfully Patient tolerance: Patient tolerated the procedure well with no immediate complications  .Central Line Date/Time: 05/28/2015 1:00 AM Performed by: Gavin Pound Authorized by: Nelva Nay Consent: Written consent obtained. Risks and benefits: risks, benefits and alternatives were discussed Consent given by: spouse Patient understanding: patient states understanding of the procedure being performed Patient consent: the patient's understanding of the procedure matches consent given Procedure consent: procedure consent matches procedure scheduled Relevant documents: relevant documents present and verified Test results: test results available and properly labeled Site marked: the operative site was marked Imaging studies: imaging studies available Required items: required blood products, implants, devices, and special equipment available Patient identity confirmed: arm band Time out: Immediately prior to procedure a "time out" was called to verify the correct patient, procedure, equipment, support staff and site/side marked as required. Indications: vascular access Preparation: skin prepped with 2% chlorhexidine Skin prep agent dried: skin prep agent completely dried prior to procedure Sterile barriers: all five maximum sterile barriers used - cap, mask, sterile gown, sterile gloves, and large sterile sheet Hand hygiene: hand hygiene performed prior to central venous catheter insertion Location details: right subclavian Site selection rationale: Pt in C-collar Patient position: flat Catheter type: triple lumen Catheter size: 8.5  Fr Pre-procedure: landmarks identified Ultrasound guidance: yes Sterile ultrasound techniques: sterile gel and sterile probe covers were used Number of attempts: 2 Successful placement: yes Post-procedure: line sutured and dressing applied Assessment: blood return through all ports,  free fluid flow,  placement verified by x-ray and no pneumothorax on x-ray Patient tolerance: Patient tolerated the procedure well with no immediate complications  ARTERIAL LINE Date/Time: 05/28/2015 1:30 AM Performed by: Gavin Pound Authorized by: Nelva Nay Consent: Written consent obtained. Consent given by: spouse Patient understanding: patient states understanding of the procedure being performed Patient consent: the patient's understanding of the procedure matches consent given Procedure consent: procedure consent matches procedure scheduled Relevant documents: relevant documents present and verified Test results: test results available and properly labeled Site marked: the operative site was marked Imaging studies: imaging studies available Required items: required blood products, implants, devices, and special equipment available Patient identity confirmed: arm band Time out: Immediately prior to procedure a "time out" was called to verify the correct patient, procedure, equipment, support staff and site/side marked as required. Preparation: Patient was prepped and draped in the usual sterile fashion. Indications: multiple ABGs, respiratory failure and hemodynamic monitoring Location: right radial Allen's test normal: yes Needle gauge: 22 Seldinger technique: Seldinger technique used Number of attempts: 2 Post-procedure: line sutured and dressing applied Post-procedure CMS: Capillary refill intact, pt unresponsive. Patient tolerance: Patient tolerated the procedure well with no immediate complications  .Marland KitchenLaceration Repair Date/Time: 05/28/2015 2:30 AM Performed by: Gavin Pound Authorized by: Nelva Nay Consent: Verbal consent obtained. Required items: required blood products, implants, devices, and special equipment available Patient identity confirmed:  arm band Body area: head/neck Location details: scalp Laceration length: 5 cm Foreign bodies: unknown Tendon involvement: none Nerve involvement: none Vascular damage: no Irrigation solution: saline Irrigation method: jet lavage Amount of cleaning: standard Debridement: none Degree of undermining: none Skin closure: staples Number of sutures: 10 Approximation: close Approximation difficulty: simple Patient tolerance: Patient tolerated the procedure well with no immediate complications   (including critical care time) Labs Review Labs Reviewed  ETHANOL - Abnormal; Notable for the following:    Alcohol, Ethyl (B) 413 (*)    All other components within normal limits  CBC - Abnormal; Notable for the following:    RBC 4.09 (*)    MCV 101.7 (*)    All other components within normal limits  COMPREHENSIVE METABOLIC PANEL - Abnormal; Notable for the following:    CO2 19 (*)    Glucose, Bld 166 (*)    Creatinine, Ser 1.44 (*)    Calcium 8.6 (*)    Total Protein 6.0 (*)    AST 309 (*)    ALT 174 (*)    GFR calc non Af Amer 50 (*)    GFR calc Af Amer 58 (*)    All other components within normal limits  URINALYSIS, ROUTINE W REFLEX MICROSCOPIC (NOT AT [facility]) - Abnormal; Notable for the following:    APPearance CLOUDY (*)    Hgb urine dipstick MODERATE (*)    Protein, ur 30 (*)    Leukocytes, UA TRACE (*)    All other components within normal limits  MAGNESIUM - Abnormal; Notable for the following:    Magnesium 2.7 (*)    All other components within normal limits  URINE MICROSCOPIC-ADD ON - Abnormal; Notable for the following:    Squamous Epithelial / LPF FEW (*)    Bacteria, UA FEW (*)    All other components within normal limits  I-STAT CHEM 8, ED - Abnormal; Notable for the following:     Sodium 146 (*)    Potassium 3.0 (*)    BUN 22 (*)    Creatinine, Ser 1.50 (*)    Glucose, Bld 107 (*)    Calcium, Ion 1.06 (*)    All other components within normal limits  I-STAT CG4 LACTIC ACID, ED - Abnormal; Notable for the following:    Lactic Acid, Venous 9.31 (*)    All other components within normal limits  I-STAT ARTERIAL BLOOD GAS, ED - Abnormal; Notable for the following:    pH, Arterial 6.944 (*)    pCO2 arterial 53.2 (*)    pO2, Arterial 581.0 (*)    Bicarbonate 12.2 (*)    Acid-base deficit 21.0 (*)    All other components within normal limits  I-STAT ARTERIAL BLOOD GAS, ED - Abnormal; Notable for the following:    pH, Arterial 7.218 (*)    pCO2 arterial 22.9 (*)    pO2, Arterial 217.0 (*)    Bicarbonate 9.6 (*)    Acid-base deficit 17.0 (*)    All other components within normal limits  URINE CULTURE  MRSA PCR SCREENING  URINE RAPID DRUG SCREEN, HOSP PERFORMED  CBC  BASIC METABOLIC PANEL  BLOOD GAS, ARTERIAL  MAGNESIUM  PHOSPHORUS  TROPONIN I  TROPONIN I  TROPONIN I  PROTIME-INR  LACTIC ACID, PLASMA  LACTIC ACID, PLASMA  BLOOD GAS, ARTERIAL  I-STAT TROPOININ, ED  I-STAT CG4 LACTIC ACID, ED    Imaging Review Ct Head Wo Contrast  05/09/2015  CLINICAL DATA:  Patient found unresponsive. Laceration  to the back of the head. Initial encounter. EXAM: CT HEAD WITHOUT CONTRAST CT CERVICAL SPINE WITHOUT CONTRAST TECHNIQUE: Multidetector CT imaging of the head and cervical spine was performed following the standard protocol without intravenous contrast. Multiplanar CT image reconstructions of the cervical spine were also generated. COMPARISON:  None. FINDINGS: CT HEAD FINDINGS There is no evidence of acute infarction, mass lesion, or intra- or extra-axial hemorrhage on CT. Mild periventricular white matter change likely reflects small vessel ischemic microangiopathy. The posterior fossa, including the cerebellum, brainstem and fourth ventricle, is within normal  limits. The third and lateral ventricles, and basal ganglia are unremarkable in appearance. The cerebral hemispheres are symmetric in appearance, with normal gray-white differentiation. No mass effect or midline shift is seen. There is no evidence of fracture; visualized osseous structures are unremarkable in appearance. The visualized portions of the orbits are within normal limits. There is minimal partial opacification of the right maxillary sinus. The remaining paranasal sinuses and mastoid air cells are well-aerated. No significant soft tissue abnormalities are seen. CT CERVICAL SPINE FINDINGS There is no evidence of fracture or subluxation. The patient is status post anterior cervical spinal fusion at C5-C7, with mild underlying degenerative change. Vertebral bodies demonstrate normal height and alignment. Prevertebral soft tissues are not well assessed due to the patient's endotracheal tube. The thyroid gland is unremarkable in appearance. The visualized lung apices are clear. A soft tissue laceration is noted along the posterior upper neck, with associated soft tissue air. IMPRESSION: 1. No evidence of traumatic intracranial injury or fracture. 2. No evidence of fracture or subluxation along the cervical spine. 3. Soft tissue laceration along the posterior upper neck, with associated soft tissue air. 4. Mild small vessel ischemic microangiopathy. 5. Minimal partial opacification of the right maxillary sinus. 6. Status post anterior cervical spinal fusion at C5-C7. Electronically Signed   By: Roanna Raider M.D.   On: 05/25/2015 23:53   Ct Cervical Spine Wo Contrast  05/12/2015  CLINICAL DATA:  Patient found unresponsive. Laceration to the back of the head. Initial encounter. EXAM: CT HEAD WITHOUT CONTRAST CT CERVICAL SPINE WITHOUT CONTRAST TECHNIQUE: Multidetector CT imaging of the head and cervical spine was performed following the standard protocol without intravenous contrast. Multiplanar CT image  reconstructions of the cervical spine were also generated. COMPARISON:  None. FINDINGS: CT HEAD FINDINGS There is no evidence of acute infarction, mass lesion, or intra- or extra-axial hemorrhage on CT. Mild periventricular white matter change likely reflects small vessel ischemic microangiopathy. The posterior fossa, including the cerebellum, brainstem and fourth ventricle, is within normal limits. The third and lateral ventricles, and basal ganglia are unremarkable in appearance. The cerebral hemispheres are symmetric in appearance, with normal gray-white differentiation. No mass effect or midline shift is seen. There is no evidence of fracture; visualized osseous structures are unremarkable in appearance. The visualized portions of the orbits are within normal limits. There is minimal partial opacification of the right maxillary sinus. The remaining paranasal sinuses and mastoid air cells are well-aerated. No significant soft tissue abnormalities are seen. CT CERVICAL SPINE FINDINGS There is no evidence of fracture or subluxation. The patient is status post anterior cervical spinal fusion at C5-C7, with mild underlying degenerative change. Vertebral bodies demonstrate normal height and alignment. Prevertebral soft tissues are not well assessed due to the patient's endotracheal tube. The thyroid gland is unremarkable in appearance. The visualized lung apices are clear. A soft tissue laceration is noted along the posterior upper neck, with associated soft tissue air.  IMPRESSION: 1. No evidence of traumatic intracranial injury or fracture. 2. No evidence of fracture or subluxation along the cervical spine. 3. Soft tissue laceration along the posterior upper neck, with associated soft tissue air. 4. Mild small vessel ischemic microangiopathy. 5. Minimal partial opacification of the right maxillary sinus. 6. Status post anterior cervical spinal fusion at C5-C7. Electronically Signed   By: Roanna Raider M.D.   On:  2015-06-03 23:53   Dg Chest Portable 1 View  05/28/2015  CLINICAL DATA:  Central line placement. EXAM: PORTABLE CHEST 1 VIEW COMPARISON:  Yesterday at 2316 hour FINDINGS: Endotracheal tube 5 cm from the carina. Enteric tube in place, tip and side port below the diaphragm. New right subclavian central line with tip in the SVC. No evidence of pneumothorax. Multiple overlying monitoring devices again seen. Mild right basilar atelectasis is unchanged. Diminished left basilar atelectasis. Cardiomediastinal contours are unchanged. No new airspace disease. No pulmonary edema or pleural effusion. IMPRESSION: 1. Tip of the right central line in the mid SVC.  No pneumothorax. 2. Endotracheal and enteric tubes in place. 3. Mild right basilar atelectasis. Electronically Signed   By: Rubye Oaks M.D.   On: 05/28/2015 02:51   Dg Chest Portable 1 View  2015/06/03  CLINICAL DATA:  Intubation. EXAM: PORTABLE CHEST 1 VIEW COMPARISON:  Earlier this evening at 2310 hour. FINDINGS: Endotracheal tube is approximately 4 cm from the carina. Enteric tube in place, tip below the diaphragm not included in the field of view. Lung volumes are low. Left costophrenic angle is excluded from the field of view. There is bibasilar atelectasis. Cardiomediastinal contours are unchanged. No pneumothorax. No acute osseous abnormalities are seen. Artifact projects over the left shoulder. IMPRESSION: 1. Endotracheal tube 4 cm from the carina.  Enteric tube in place. 2. Low lung volumes with bibasilar atelectasis. Electronically Signed   By: Rubye Oaks M.D.   On: 06-03-15 23:49   Dg Chest Portable 1 View  2015-06-03  CLINICAL DATA:  64 year old male with respiratory distress. Intubated. EXAM: PORTABLE CHEST 1 VIEW COMPARISON:  10/18/2003 FINDINGS: The cardiomediastinal silhouette is unremarkable. An endotracheal tube is present with tip 2 cm above the carina. An NG tube is present coiled in the proximal stomach. Mild elevation of  right hemidiaphragm is again noted. There is no evidence of focal airspace disease, pulmonary edema, suspicious pulmonary nodule/mass, pleural effusion, or pneumothorax. No acute bony abnormalities are identified. IMPRESSION: Support apparatus as described. No evidence of acute cardiopulmonary disease. Electronically Signed   By: Harmon Pier M.D.   On: 06-03-15 23:43   I have personally reviewed and evaluated these images and lab results as part of my medical decision-making.   MDM   Final diagnoses:  None   Christopher Barber is a 64 y.o. male  Who presented s/p cardiac arrest.  Per EMS report, pt   had heavy intake of EtOH this evening. Subsequently fell and has had at home then became unresponsive. He stopped breathing and CPR was initiated by his spouse while on the phone with 911. Per EMS while supporting respirations in route to the hospital patient went into PEA arrest. He received 3 rounds of epinephrine with chest compressions for 10-15 minutes. ROSC was obtained. He was also noted to be in atrial fibrillation with RVR.  This was shortly before arrival to the emergency department. Patient arrived receiving chest compressions prior to pulse recheck. At initial pulse check patient was noted to have carotid and femoral pulses. Bedside ultrasound showed  cardiac activity with reduced EF.  Patient's initial blood pressure was stable but his pressures were downtrending. Also oxygen saturation was difficult to obtain and patient was continued on BVM and IV fluid infusion in preparation for intubation. O2 saturations improved.  Following intubation patient had transient hypotension improved with an additional dose of epinephrine but did not lose pulses. Patient given additional IV fluids for a total of 4 L. CT of the head obtained which did not show acute hemorrhage also no obvious fracture of the C-spine. Patient was maintained in in-line stabilization with c-collar throughout resuscitation.   Patient's pressures continued to remain soft. Cardiology and critical care were both consulted. Initial gas shows severe metabolic and respiratory acidosis. Patient given bicarbonate. Peripheral norepinephrine started, and right subclavian central line placed, along with right radial arterial line.  Patient was initially hypothermic, and unresponsive. He did begin to have some spontaneous response to pain such as biting on ET tube. Patient had an EtOH level significant above 400. Patient's pupils initially dilating, but with warming efforts began to become sluggishly reactive. Patient's family was updated throughout emergency department stabilization efforts. Patient was transferred to the ICU.   Patient care was discussed with my attending, Dr. Radford PaxBeaton.     Gavin PoundJustin Devonte Migues, MD 05/28/15 [id number]  Gavin PoundJustin Nicoletta Hush, MD 05/28/15 [id number]  Nelva Nayobert Beaton, MD 06/01/15 [id number]  Nelva Nayobert Beaton, MD 09/02/15 [phone]

## 2015-05-28 NOTE — ED Notes (Signed)
Family taken to consult room for CCM admitting to talk to them more in depth. Dr. Celene KrasBrooten back at the bedside to place Central line and arterial line.

## 2015-05-28 NOTE — Progress Notes (Signed)
EEG reviewed. Patient had no further clinical myoclonic activity during initial recording. EEG shows generalized suppression of the background rhythm. No noted epileptiform activity. Will convert to LTM and continue to monitor.   Christopher Choeter Marlet Korte, DO [facility] [phone]  If 7pm- 7am, please page neurology on call as listed in AMION.

## 2015-05-28 NOTE — ED Notes (Signed)
More family at the bedside and updated on pt status

## 2015-05-28 NOTE — ED Notes (Signed)
Switched into an aspen collar while maintaining c-spine precautions.

## 2015-05-28 NOTE — Progress Notes (Signed)
Utilization review completed.  
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
## 2015-05-28 NOTE — ED Notes (Signed)
Dr. Radford PaxBeaton at the bedside. VO to start Levophed gtt.

## 2015-05-28 NOTE — Progress Notes (Signed)
eLink Physician-Brief Progress Note Patient Name: Christopher DegreeMichael A Barber DOB: Sep 13, 1950 MRN: [id number]   Date of Service  05/28/2015  HPI/Events of Note  Multiple issues - 1. Hypotension - BP = 86/54 by cuff, however, A-line = 99/66. Now on Norepinephrine at 40 mcg/min. 2. Diarrhea.  eICU Interventions  Will order: 1. Monitor CVP. 2. 0.9 NaCl 1 liter IV over 1 hour now. 3. Place Flexiseal.      Intervention Category Intermediate Interventions: Hypotension - evaluation and management;Other:  Sommer,Steven Dennard Nipugene 05/28/2015, 3:52 AM

## 2015-05-28 NOTE — Progress Notes (Signed)
CRITICAL VALUE ALERT  Critical value received:  Lactic Acid of 6.2  Date of notification:  05/28/15  Time of notification: 0550  Critical value read back:yes  Nurse who received alert:  Lily Peerhomas Rochele Lueck   MD notified (1st page):  Loletha GrayerSteven Sommer  Time MD responded:  [phone]

## 2015-05-28 NOTE — H&P (Signed)
PULMONARY / CRITICAL CARE MEDICINE   Name: Christopher Barber MRN: [id number] DOB: [birth year]    ADMISSION DATE:  2015-06-26 CONSULTATION DATE:  2015-06-26  REFERRING MD :  Dr. Celene Kras  CHIEF COMPLAINT:  S/p cardiac arrest  INITIAL PRESENTATION: ED  HISTORY OF PRESENT ILLNESS:  Patient is a 64 year old Caucasian male past medical history significant for hypertension, anxiety, alcohol abuse who is brought to the emergency department after suffering a cardiac arrest in his home. Patient was in his usual state of health when at approximately 10 PM the patient was intoxicated and had a fall where he hit the back part of his head against the wheel of his car. His wife was there with him and noted that he stopped breathing immediately. She called 911 and states that paramedics arrived within 5 minutes to the scene. Paramedics perform CPR for approximately 15 minutes including 3 doses of epi. The initial rhythm was PEA. Patient arrived to the emergency department and was seen by cardiology who have left recommendations in the chart  PAST MEDICAL HISTORY :   has a past medical history of Hypertension and Anxiety.  has past surgical history that includes Spine surgery (06/27/2010) and Cervical spine surgery. Prior to Admission medications   Medication Sig Start Date End Date Taking? Authorizing Provider  aspirin 81 MG tablet Take 81 mg by mouth daily.   Yes Historical Provider, MD  lisinopril (PRINIVIL,ZESTRIL) 20 MG tablet Take 20 mg by mouth daily.   Yes Historical Provider, MD  escitalopram (LEXAPRO) 10 MG tablet Take 1 tablet (10 mg total) by mouth daily. Patient not taking: Reported on 05/28/2015 02/28/14   Donita Brooks, MD  zolpidem (AMBIEN) 10 MG tablet Take 1 tablet (10 mg total) by mouth at bedtime as needed for sleep. 02/28/14 03/30/14  Donita Brooks, MD   No Known Allergies  FAMILY HISTORY:  indicated that his mother is deceased. He indicated that his father is deceased. He indicated  that all of his four sisters are alive. He indicated that his maternal grandmother is deceased. He indicated that his maternal grandfather is deceased.  SOCIAL HISTORY:  reports that he has been smoking Cigarettes.  He has been smoking about 0.50 packs per day. He has never used smokeless tobacco. He reports that he drinks alcohol. He reports that he does not use illicit drugs.  REVIEW OF SYSTEMS:  Unable to obtain as the patient is intubated and unresponsive  SUBJECTIVE:   VITAL SIGNS: Temp:  [91 F (32.8 C)-93.4 F (34.1 C)] 92.3 F (33.5 C) (10/23 0130) Pulse Rate:  [79-140] 88 (10/23 0130) Resp:  [14-25] 23 (10/23 0130) BP: (59-130)/(37-72) 82/54 mmHg (10/23 0130) SpO2:  [99 %-100 %] 100 % (10/23 0130) FiO2 (%):  [50 %-100 %] 50 % (10/23 0004) Weight:  [70 kg (154 lb 5.2 oz)] 70 kg (154 lb 5.2 oz) (10/22 2250) HEMODYNAMICS:   VENTILATOR SETTINGS: Vent Mode:  [-] PRVC FiO2 (%):  [50 %-100 %] 50 % Set Rate:  [16 bmp-22 bmp] 22 bmp Vt Set:  [433 mL] 620 mL PEEP:  [5 cmH20] 5 cmH20 Plateau Pressure:  [17 cmH20] 17 cmH20 INTAKE / OUTPUT:  Intake/Output Summary (Last 24 hours) at 05/28/15 0143 Last data filed at 05/28/15 0052  Gross per 24 hour  Intake   5000 ml  Output      0 ml  Net   5000 ml    PHYSICAL EXAMINATION: General:  Patient is a 64 year old Caucasian male who appears his  stated age. He does not appear to be in any acute distress. He is not alert nor awake nor oriented. He is well-nourished and well-developed Neuro:  Unable to assess any of the cranial nerves. PERRL although they are sluggish. Patient does not cooperate for extraocular movements. I am unable to assess Sensation and strength. The patient does not grimace to sternal rub, squeezing of the trapezius muscles, patient does not flex nor extend to painful stimuli in his 4 distal extremities. HEENT: Head, normocephalic, laceration his posterior skull. Ears symmetric, permeable. Eyes: no conjunctival icterus,  no erythema. Nose: permeable, midline septum NGT present. oropharynx with endotracheal tube in place fair dentition. Cardiovascular: S1S2 RRR no murmurs, rubs, or gallops auscultated. No thrills palpated.  Lungs:  Chest symmetrical with respirations, clear to auscultation bilaterally with no wheezing, crackles, equal expansion.  Abdomen:  Soft, nontender, no guarding, nondistended. Present bowel sounds. Musculoskeletal:  No muscle atrophy noted nor weakness. ROM intact. Gait was not assessed due to critical illness. No swelling nor tenderness of the joints.  Skin:  No notable scars, rashes, cruises. No bed sores noted. She has a laceration on the back of his head. He has a tattoo on his right forearm Lymphatics: no palpable lymphadenopathy of cervical, supraclvicular nor inguinal areas.     LABS:  CBC  Recent Labs Lab 01-29-15 2245 05/28/15 0004  WBC 9.8  --   HGB 13.2 16.3  HCT 41.6 48.0  PLT 244  --    Coag's No results for input(s): APTT, INR in the last 168 hours. BMET  Recent Labs Lab 01-29-15 2245 05/28/15 0004  NA 141 146*  K 3.6 3.0*  CL 110 111  CO2 19*  --   BUN 19 22*  CREATININE 1.44* 1.50*  GLUCOSE 166* 107*   Electrolytes  Recent Labs Lab 01-29-15 2245  CALCIUM 8.6*  MG 2.7*   Sepsis Markers  Recent Labs Lab 01-29-15 2304  LATICACIDVEN 9.31*   ABG  Recent Labs Lab 01-29-15 2357  PHART 6.944*  PCO2ART 53.2*  PO2ART 581.0*   Liver Enzymes  Recent Labs Lab 01-29-15 2245  AST 309*  ALT 174*  ALKPHOS 74  BILITOT 0.4  ALBUMIN 3.5   Cardiac Enzymes No results for input(s): TROPONINI, PROBNP in the last 168 hours. Glucose No results for input(s): GLUCAP in the last 168 hours.  Imaging Ct Head Wo Contrast  January 16, 2015  CLINICAL DATA:  Patient found unresponsive. Laceration to the back of the head. Initial encounter. EXAM: CT HEAD WITHOUT CONTRAST CT CERVICAL SPINE WITHOUT CONTRAST TECHNIQUE: Multidetector CT imaging of the head and  cervical spine was performed following the standard protocol without intravenous contrast. Multiplanar CT image reconstructions of the cervical spine were also generated. COMPARISON:  None. FINDINGS: CT HEAD FINDINGS There is no evidence of acute infarction, mass lesion, or intra- or extra-axial hemorrhage on CT. Mild periventricular white matter change likely reflects small vessel ischemic microangiopathy. The posterior fossa, including the cerebellum, brainstem and fourth ventricle, is within normal limits. The third and lateral ventricles, and basal ganglia are unremarkable in appearance. The cerebral hemispheres are symmetric in appearance, with normal gray-white differentiation. No mass effect or midline shift is seen. There is no evidence of fracture; visualized osseous structures are unremarkable in appearance. The visualized portions of the orbits are within normal limits. There is minimal partial opacification of the right maxillary sinus. The remaining paranasal sinuses and mastoid air cells are well-aerated. No significant soft tissue abnormalities are seen. CT CERVICAL SPINE  FINDINGS There is no evidence of fracture or subluxation. The patient is status post anterior cervical spinal fusion at C5-C7, with mild underlying degenerative change. Vertebral bodies demonstrate normal height and alignment. Prevertebral soft tissues are not well assessed due to the patient's endotracheal tube. The thyroid gland is unremarkable in appearance. The visualized lung apices are clear. A soft tissue laceration is noted along the posterior upper neck, with associated soft tissue air. IMPRESSION: 1. No evidence of traumatic intracranial injury or fracture. 2. No evidence of fracture or subluxation along the cervical spine. 3. Soft tissue laceration along the posterior upper neck, with associated soft tissue air. 4. Mild small vessel ischemic microangiopathy. 5. Minimal partial opacification of the right maxillary sinus. 6.  Status post anterior cervical spinal fusion at C5-C7. Electronically Signed   By: Roanna Raider M.D.   On: 05/08/2015 23:53   Ct Cervical Spine Wo Contrast  05/12/2015  CLINICAL DATA:  Patient found unresponsive. Laceration to the back of the head. Initial encounter. EXAM: CT HEAD WITHOUT CONTRAST CT CERVICAL SPINE WITHOUT CONTRAST TECHNIQUE: Multidetector CT imaging of the head and cervical spine was performed following the standard protocol without intravenous contrast. Multiplanar CT image reconstructions of the cervical spine were also generated. COMPARISON:  None. FINDINGS: CT HEAD FINDINGS There is no evidence of acute infarction, mass lesion, or intra- or extra-axial hemorrhage on CT. Mild periventricular white matter change likely reflects small vessel ischemic microangiopathy. The posterior fossa, including the cerebellum, brainstem and fourth ventricle, is within normal limits. The third and lateral ventricles, and basal ganglia are unremarkable in appearance. The cerebral hemispheres are symmetric in appearance, with normal gray-white differentiation. No mass effect or midline shift is seen. There is no evidence of fracture; visualized osseous structures are unremarkable in appearance. The visualized portions of the orbits are within normal limits. There is minimal partial opacification of the right maxillary sinus. The remaining paranasal sinuses and mastoid air cells are well-aerated. No significant soft tissue abnormalities are seen. CT CERVICAL SPINE FINDINGS There is no evidence of fracture or subluxation. The patient is status post anterior cervical spinal fusion at C5-C7, with mild underlying degenerative change. Vertebral bodies demonstrate normal height and alignment. Prevertebral soft tissues are not well assessed due to the patient's endotracheal tube. The thyroid gland is unremarkable in appearance. The visualized lung apices are clear. A soft tissue laceration is noted along the  posterior upper neck, with associated soft tissue air. IMPRESSION: 1. No evidence of traumatic intracranial injury or fracture. 2. No evidence of fracture or subluxation along the cervical spine. 3. Soft tissue laceration along the posterior upper neck, with associated soft tissue air. 4. Mild small vessel ischemic microangiopathy. 5. Minimal partial opacification of the right maxillary sinus. 6. Status post anterior cervical spinal fusion at C5-C7. Electronically Signed   By: Roanna Raider M.D.   On: 05/17/2015 23:53   Dg Chest Portable 1 View  05/22/2015  CLINICAL DATA:  Intubation. EXAM: PORTABLE CHEST 1 VIEW COMPARISON:  Earlier this evening at 2310 hour. FINDINGS: Endotracheal tube is approximately 4 cm from the carina. Enteric tube in place, tip below the diaphragm not included in the field of view. Lung volumes are low. Left costophrenic angle is excluded from the field of view. There is bibasilar atelectasis. Cardiomediastinal contours are unchanged. No pneumothorax. No acute osseous abnormalities are seen. Artifact projects over the left shoulder. IMPRESSION: 1. Endotracheal tube 4 cm from the carina.  Enteric tube in place. 2. Low lung volumes with  bibasilar atelectasis. Electronically Signed   By: Rubye Oaks M.D.   On: 06/17/2015 23:49   Dg Chest Portable 1 View  06-17-15  CLINICAL DATA:  64 year old male with respiratory distress. Intubated. EXAM: PORTABLE CHEST 1 VIEW COMPARISON:  10/18/2003 FINDINGS: The cardiomediastinal silhouette is unremarkable. An endotracheal tube is present with tip 2 cm above the carina. An NG tube is present coiled in the proximal stomach. Mild elevation of right hemidiaphragm is again noted. There is no evidence of focal airspace disease, pulmonary edema, suspicious pulmonary nodule/mass, pleural effusion, or pneumothorax. No acute bony abnormalities are identified. IMPRESSION: Support apparatus as described. No evidence of acute cardiopulmonary disease.  Electronically Signed   By: Harmon Pier M.D.   On: 2015-06-17 23:43     ASSESSMENT / PLAN:  PULMONARY OETT placed 06/17/2015 A: Acute hypoxemic respiratory failure secondary to lack of airway protection P:  Endotracheal tube in place, titration of vent settings, serial ABGs for acidosis   CARDIOVASCULAR CVL central line is currently being placed by emergency medicine physician A: Status post cardiac arrest of likely hypoxic etiology, currently with hypothermia Shock of uncertain etiology, potentially distributive from hypothermia - lactic Acidosis Cardiology was consulted by emergency physician P: 2-D echo to be performed in the a.m., serial troponins, warming blankets and fluids   RENAL A:  No active issues P:     GASTROINTESTINAL A:  Transaminitis likely secondary to alcohol, AST to ALT ratio of approximately 2-1 P:  check an INR   HEMATOLOGIC A:  Macrocytosis likely secondary to alcohol abuse P: DVT prophylaxis with Lovenox.   INFECTIOUS A:  No active issues P:    ENDOCRINE A:  No active issues   P:     NEUROLOGIC A:  Toxic metabolic encephalopathy secondary to cardiac arrest, potential anoxic brain injury P:  Serial neuro exams. I will hold sedation. CT was reviewed personally RASS goal: 0    FAMILY  - Updates: I held a meeting with the family including spouse as well as multiple sons and daughters where I expressed a severity of his current situation. On numerous occasions they reasonably asked me to prognosticate regarding his recovery but I deferred stating that at this point we just do not know. I expressed that it would take more time to allow him to potentially recover and we should allow 24-48 hours  - Inter-disciplinary family meet or Palliative Care meeting due by:  day 7  Critical care time 45 minutes including titration of vasopressors, management of the ventilator, extensive family discussions.  Deetta Perla, MD Critical Care  Medicine [facility] Pager: [phone]  05/28/2015, 1:43 AM

## 2015-05-28 NOTE — Progress Notes (Signed)
STAT LTM up and running  

## 2015-05-28 NOTE — Progress Notes (Signed)
Chaplain met family in the waiting room and escorted them to the consultation room.   °MD met with family to update, then led them to Trauma A to sit at bedside. ° °Wife and her son were present, other adult children enroute. ° °Rev. Jan Hill, Chaplain °[phone] °

## 2015-05-29 ENCOUNTER — Other Ambulatory Visit (HOSPITAL_COMMUNITY): Payer: Non-veteran care

## 2015-05-29 ENCOUNTER — Inpatient Hospital Stay (HOSPITAL_COMMUNITY): Payer: Non-veteran care

## 2015-05-29 DIAGNOSIS — J9601 Acute respiratory failure with hypoxia: Secondary | ICD-10-CM

## 2015-05-29 DIAGNOSIS — J9602 Acute respiratory failure with hypercapnia: Secondary | ICD-10-CM | POA: Diagnosis not present

## 2015-05-29 DIAGNOSIS — I469 Cardiac arrest, cause unspecified: Secondary | ICD-10-CM

## 2015-05-29 LAB — BASIC METABOLIC PANEL
ANION GAP: 5 (ref 5–15)
BUN: 26 mg/dL — AB (ref 6–20)
CALCIUM: 6.2 mg/dL — AB (ref 8.9–10.3)
CO2: 18 mmol/L — AB (ref 22–32)
Chloride: 122 mmol/L — ABNORMAL HIGH (ref 101–111)
Creatinine, Ser: 1.99 mg/dL — ABNORMAL HIGH (ref 0.61–1.24)
GFR calc Af Amer: 39 mL/min — ABNORMAL LOW (ref >60)
GFR calc non Af Amer: 34 mL/min — ABNORMAL LOW (ref >60)
GLUCOSE: 109 mg/dL — AB (ref 65–99)
POTASSIUM: 3.9 mmol/L (ref 3.5–5.1)
SODIUM: 145 mmol/L (ref 135–145)

## 2015-05-29 LAB — BLOOD GAS, ARTERIAL
Acid-base deficit: 10 mmol/L — ABNORMAL HIGH (ref 0.0–2.0)
Bicarbonate: 13.9 mEq/L — ABNORMAL LOW (ref 20.0–24.0)
Drawn by: 36496
FIO2: 0.3
O2 SAT: 98.8 %
PEEP: 5 cmH2O
PH ART: 7.398 (ref 7.350–7.450)
Patient temperature: 98.6
RATE: 24 resp/min
TCO2: 14.6 mmol/L (ref 0–100)
VT: 620 mL
pCO2 arterial: 23 mmHg — ABNORMAL LOW (ref 35.0–45.0)
pO2, Arterial: 127 mmHg — ABNORMAL HIGH (ref 80.0–100.0)

## 2015-05-29 LAB — GLUCOSE, CAPILLARY
GLUCOSE-CAPILLARY: 121 mg/dL — AB (ref 65–99)
Glucose-Capillary: 113 mg/dL — ABNORMAL HIGH (ref 65–99)
Glucose-Capillary: 148 mg/dL — ABNORMAL HIGH (ref 65–99)
Glucose-Capillary: 91 mg/dL (ref 65–99)
Glucose-Capillary: 108 mg/dL — ABNORMAL HIGH (ref 65–99)
Glucose-Capillary: 115 mg/dL — ABNORMAL HIGH (ref 65–99)

## 2015-05-29 LAB — MAGNESIUM: Magnesium: 1.3 mg/dL — ABNORMAL LOW (ref 1.7–2.4)

## 2015-05-29 LAB — URINE CULTURE: Culture: NO GROWTH

## 2015-05-29 LAB — LACTIC ACID, PLASMA: LACTIC ACID, VENOUS: 5 mmol/L — AB (ref 0.5–2.0)

## 2015-05-29 LAB — VALPROIC ACID LEVEL: VALPROIC ACID LVL: 57 ug/mL (ref 50.0–100.0)

## 2015-05-29 MED ORDER — MAGNESIUM SULFATE 2 GM/50ML IV SOLN
2.0000 g | Freq: Once | INTRAVENOUS | Status: AC
  Administered 2015-05-29: 2 g via INTRAVENOUS
  Filled 2015-05-29: qty 50

## 2015-05-29 MED ORDER — SODIUM CHLORIDE 0.9 % IV BOLUS (SEPSIS)
1000.0000 mL | Freq: Once | INTRAVENOUS | Status: AC
Start: 2015-05-29 — End: 2015-05-29
  Administered 2015-05-29: 1000 mL via INTRAVENOUS

## 2015-05-29 MED ORDER — PERFLUTREN LIPID MICROSPHERE
1.0000 mL | INTRAVENOUS | Status: AC | PRN
  Administered 2015-05-29: 2 mL via INTRAVENOUS
  Filled 2015-05-29: qty 10

## 2015-05-29 NOTE — Progress Notes (Signed)
eLink Physician-Brief Progress Note Patient Name: Christopher DegreeMichael A Barber DOB: 1951/03/20 MRN: [id number]   Date of Service  05/29/2015  HPI/Events of Note  A-line appears to have infiltrated.   eICU Interventions  Will D/C A-line.      Intervention Category Intermediate Interventions: Other:  [name] 05/29/2015, 12:19 AM

## 2015-05-29 NOTE — Progress Notes (Signed)
Nutrition Brief Note  Was consulted on 10/23 for assessment and Recommendations for TF  Wt Readings from Last 15 Encounters:  05/29/15 205 lb 0.4 oz (93 kg)  02/28/14 180 lb (81.647 kg)   Per nurse, likely withdrawal of care in the near future in light of very poor neuro examination. TF is highly unlikely at this point of time. Plan is to monitor for improvement for 24-48 hours. No feeding at this time.   No nutrition interventions warranted at this time. If nutrition issues arise, please re-consult RD.   Christophe LouisNathan Ailana Cuadrado RD, LDN Nutrition Pager: [phone] 05/29/2015 11:50 AM

## 2015-05-29 NOTE — Progress Notes (Signed)
CRITICAL VALUE ALERT  Critical value received:  Calcium 6.2   Date of notification:  05/29/15  Time of notification:  645  Critical value read back:Yes  Nurse who received alert:  Lily Peerhomas Zehra Rucci   MD notified (1st page):  Loletha GrayerSteven Sommer   Responding MD:  Loletha GrayerSteven Sommer  Time MD responded:  [phone]

## 2015-05-29 NOTE — Progress Notes (Signed)
eLink Physician-Brief Progress Note Patient Name: Christopher DegreeMichael A Scripter DOB: October 03, 1950 MRN: [id number]   Date of Service  05/29/2015  HPI/Events of Note  Lactic Acid is slow to clear. Lactic Acid = 5.1 >> 5.0. BP = 96/58 with MAP = 67, Hgb - 12.6 and CVP = 4 - 6.  eICU Interventions  Will bolus with 0.9 NaCl 1 liter IV over 1 hour now.      Intervention Category Major Interventions: Acid-Base disturbance - evaluation and management  Crystal Ellwood Eugene 05/29/2015, 1:42 AM

## 2015-05-29 NOTE — Progress Notes (Signed)
Echocardiogram 2D Echocardiogram with Definity has been performed.  Nolon RodBrown, Tony 05/29/2015, 4:51 PM

## 2015-05-29 NOTE — Consult Note (Signed)
Subjective: Currently intubated and on Fentanyl. No seizure activity noted. Remains unresponsive.   Objective: Current vital signs: BP 152/75 mmHg  Pulse 114  Temp(Src) 99.5 F (37.5 C) (Core (Comment))  Resp 18  Ht 5\' 11"  (1.803 m)  Wt 93 kg (205 lb 0.4 oz)  BMI 28.61 kg/m2  SpO2 100% Vital signs in last 24 hours: Temp:  [99 F (37.2 C)-100.6 F (38.1 C)] 99.5 F (37.5 C) (10/24 0945) Pulse Rate:  [90-140] 114 (10/24 0945) Resp:  [0-34] 18 (10/24 0945) BP: (96-163)/(53-95) 152/75 mmHg (10/24 0945) SpO2:  [97 %-100 %] 100 % (10/24 0945) Arterial Line BP: (81-162)/(67-114) 89/82 mmHg (10/23 2330) FiO2 (%):  [30 %-40 %] 30 % (10/24 0756) Weight:  [93 kg (205 lb 0.4 oz)] 93 kg (205 lb 0.4 oz) (10/24 0500)  Intake/Output from previous day: 10/23 0701 - 10/24 0700 In: 5955.1 [I.V.:4467.7; IV Piggyback:1487.5] Out: 1065 [Urine:715; Stool:350] Intake/Output this shift: Total I/O In: 162.9 [I.V.:162.9] Out: -  Nutritional status: Diet NPO time specified  Neurologic Exam: Mental Status: Patient does not respond to verbal stimuli.  Does not respond to deep sternal rub.  With nipple pain he does show flexion of arms and wrist.  Does not follow commands.  No verbalizations noted.  Cranial Nerves: II: patient does not respond confrontation bilaterally, pupils right 2 mm, left 2 mm,and reactive bilaterally III,IV,VI: doll's response present bilaterally.  V,VII: corneal reflex present bilaterally  VIII: patient does not respond to verbal stimuli IX,X: gag reflex absent, XI: trapezius strength unable to test bilaterally XII: tongue strength unable to test Motor: Extremities flaccid throughout.  With nipple noxious stimuli flexes at elbows and wrist bilatearlly.  No purposeful movements noted. Sensory: Does not respond to noxious stimuli in any extremity. Deep Tendon Reflexes:  2+ bilaterally UE and KJ no AJ throughout. Plantars: absent bilaterally Cerebellar: Unable to  perform    Lab Results: Basic Metabolic Panel:  Recent Labs Lab 05/20/2015 2245 05/28/15 0004 05/28/15 0420 05/28/15 1445 05/29/15 0600  NA 141 146* 139 142 145  K 3.6 3.0* 4.3 3.9 3.9  CL 110 111 112* 116* 122*  CO2 19*  --  9* 17* 18*  GLUCOSE 166* 107* 179* 163* 109*  BUN 19 22* 20 24* 26*  CREATININE 1.44* 1.50* 1.36* 1.78* 1.99*  CALCIUM 8.6*  --  6.3* 6.9* 6.2*  MG 2.7*  --  1.3* 1.4* 1.3*  PHOS  --   --  4.3  --   --     Liver Function Tests:  Recent Labs Lab 05/11/2015 2245  AST 309*  ALT 174*  ALKPHOS 74  BILITOT 0.4  PROT 6.0*  ALBUMIN 3.5   No results for input(s): LIPASE, AMYLASE in the last 168 hours. No results for input(s): AMMONIA in the last 168 hours.  CBC:  Recent Labs Lab 05/14/2015 2245 05/28/15 0004 05/28/15 0630 05/29/15 0600  WBC 9.8  --  20.9* 8.5  HGB 13.2 16.3 12.3* 9.6*  HCT 41.6 48.0 36.7* 28.4*  MCV 101.7*  --  97.9 97.9  PLT 244  --  241 112*    Cardiac Enzymes:  Recent Labs Lab 05/28/15 0400 05/28/15 0815 05/28/15 1445  TROPONINI 0.06* 0.10* 0.07*    Lipid Panel: No results for input(s): CHOL, TRIG, HDL, CHOLHDL, VLDL, LDLCALC in the last 168 hours.  CBG:  Recent Labs Lab 05/28/15 1651 05/28/15 1959 05/29/15 0034 05/29/15 0415 05/29/15 0740  GLUCAP 148* 119* 148* 91 113*    Microbiology: Results for  orders placed or performed during the hospital encounter of 10/28/2014  MRSA PCR Screening     Status: None   Collection Time: 05/28/15  3:39 AM  Result Value Ref Range Status   MRSA by PCR NEGATIVE NEGATIVE Final    Comment:        The GeneXpert MRSA Assay (FDA approved for NASAL specimens only), is one component of a comprehensive MRSA colonization surveillance program. It is not intended to diagnose MRSA infection nor to guide or monitor treatment for MRSA infections.     Coagulation Studies:  Recent Labs  05/28/15 0400  LABPROT 16.2*  INR 1.28    Imaging: Ct Head Wo  Contrast  11/10/14  CLINICAL DATA:  Patient found unresponsive. Laceration to the back of the head. Initial encounter. EXAM: CT HEAD WITHOUT CONTRAST CT CERVICAL SPINE WITHOUT CONTRAST TECHNIQUE: Multidetector CT imaging of the head and cervical spine was performed following the standard protocol without intravenous contrast. Multiplanar CT image reconstructions of the cervical spine were also generated. COMPARISON:  None. FINDINGS: CT HEAD FINDINGS There is no evidence of acute infarction, mass lesion, or intra- or extra-axial hemorrhage on CT. Mild periventricular white matter change likely reflects small vessel ischemic microangiopathy. The posterior fossa, including the cerebellum, brainstem and fourth ventricle, is within normal limits. The third and lateral ventricles, and basal ganglia are unremarkable in appearance. The cerebral hemispheres are symmetric in appearance, with normal gray-white differentiation. No mass effect or midline shift is seen. There is no evidence of fracture; visualized osseous structures are unremarkable in appearance. The visualized portions of the orbits are within normal limits. There is minimal partial opacification of the right maxillary sinus. The remaining paranasal sinuses and mastoid air cells are well-aerated. No significant soft tissue abnormalities are seen. CT CERVICAL SPINE FINDINGS There is no evidence of fracture or subluxation. The patient is status post anterior cervical spinal fusion at C5-C7, with mild underlying degenerative change. Vertebral bodies demonstrate normal height and alignment. Prevertebral soft tissues are not well assessed due to the patient's endotracheal tube. The thyroid gland is unremarkable in appearance. The visualized lung apices are clear. A soft tissue laceration is noted along the posterior upper neck, with associated soft tissue air. IMPRESSION: 1. No evidence of traumatic intracranial injury or fracture. 2. No evidence of fracture or  subluxation along the cervical spine. 3. Soft tissue laceration along the posterior upper neck, with associated soft tissue air. 4. Mild small vessel ischemic microangiopathy. 5. Minimal partial opacification of the right maxillary sinus. 6. Status post anterior cervical spinal fusion at C5-C7. Electronically Signed   By: Roanna RaiderJeffery  Chang M.D.   On: 11/10/14 23:53   Ct Cervical Spine Wo Contrast  11/10/14  CLINICAL DATA:  Patient found unresponsive. Laceration to the back of the head. Initial encounter. EXAM: CT HEAD WITHOUT CONTRAST CT CERVICAL SPINE WITHOUT CONTRAST TECHNIQUE: Multidetector CT imaging of the head and cervical spine was performed following the standard protocol without intravenous contrast. Multiplanar CT image reconstructions of the cervical spine were also generated. COMPARISON:  None. FINDINGS: CT HEAD FINDINGS There is no evidence of acute infarction, mass lesion, or intra- or extra-axial hemorrhage on CT. Mild periventricular white matter change likely reflects small vessel ischemic microangiopathy. The posterior fossa, including the cerebellum, brainstem and fourth ventricle, is within normal limits. The third and lateral ventricles, and basal ganglia are unremarkable in appearance. The cerebral hemispheres are symmetric in appearance, with normal gray-white differentiation. No mass effect or midline shift is seen. There  is no evidence of fracture; visualized osseous structures are unremarkable in appearance. The visualized portions of the orbits are within normal limits. There is minimal partial opacification of the right maxillary sinus. The remaining paranasal sinuses and mastoid air cells are well-aerated. No significant soft tissue abnormalities are seen. CT CERVICAL SPINE FINDINGS There is no evidence of fracture or subluxation. The patient is status post anterior cervical spinal fusion at C5-C7, with mild underlying degenerative change. Vertebral bodies demonstrate normal height  and alignment. Prevertebral soft tissues are not well assessed due to the patient's endotracheal tube. The thyroid gland is unremarkable in appearance. The visualized lung apices are clear. A soft tissue laceration is noted along the posterior upper neck, with associated soft tissue air. IMPRESSION: 1. No evidence of traumatic intracranial injury or fracture. 2. No evidence of fracture or subluxation along the cervical spine. 3. Soft tissue laceration along the posterior upper neck, with associated soft tissue air. 4. Mild small vessel ischemic microangiopathy. 5. Minimal partial opacification of the right maxillary sinus. 6. Status post anterior cervical spinal fusion at C5-C7. Electronically Signed   By: Roanna Raider M.D.   On: 28-May-2015 23:53   Dg Chest Portable 1 View  05/28/2015  CLINICAL DATA:  Central line placement. EXAM: PORTABLE CHEST 1 VIEW COMPARISON:  Yesterday at 2316 hour FINDINGS: Endotracheal tube 5 cm from the carina. Enteric tube in place, tip and side port below the diaphragm. New right subclavian central line with tip in the SVC. No evidence of pneumothorax. Multiple overlying monitoring devices again seen. Mild right basilar atelectasis is unchanged. Diminished left basilar atelectasis. Cardiomediastinal contours are unchanged. No new airspace disease. No pulmonary edema or pleural effusion. IMPRESSION: 1. Tip of the right central line in the mid SVC.  No pneumothorax. 2. Endotracheal and enteric tubes in place. 3. Mild right basilar atelectasis. Electronically Signed   By: Rubye Oaks M.D.   On: 05/28/2015 02:51   Dg Chest Portable 1 View  May 28, 2015  CLINICAL DATA:  Intubation. EXAM: PORTABLE CHEST 1 VIEW COMPARISON:  Earlier this evening at 2310 hour. FINDINGS: Endotracheal tube is approximately 4 cm from the carina. Enteric tube in place, tip below the diaphragm not included in the field of view. Lung volumes are low. Left costophrenic angle is excluded from the field of  view. There is bibasilar atelectasis. Cardiomediastinal contours are unchanged. No pneumothorax. No acute osseous abnormalities are seen. Artifact projects over the left shoulder. IMPRESSION: 1. Endotracheal tube 4 cm from the carina.  Enteric tube in place. 2. Low lung volumes with bibasilar atelectasis. Electronically Signed   By: Rubye Oaks M.D.   On: 2015-05-28 23:49   Dg Chest Portable 1 View  05-28-15  CLINICAL DATA:  64 year old male with respiratory distress. Intubated. EXAM: PORTABLE CHEST 1 VIEW COMPARISON:  10/18/2003 FINDINGS: The cardiomediastinal silhouette is unremarkable. An endotracheal tube is present with tip 2 cm above the carina. An NG tube is present coiled in the proximal stomach. Mild elevation of right hemidiaphragm is again noted. There is no evidence of focal airspace disease, pulmonary edema, suspicious pulmonary nodule/mass, pleural effusion, or pneumothorax. No acute bony abnormalities are identified. IMPRESSION: Support apparatus as described. No evidence of acute cardiopulmonary disease. Electronically Signed   By: Harmon Pier M.D.   On: May 28, 2015 23:43   Dg Abd Portable 1v  05/28/2015  CLINICAL DATA:  64 year old male-feeding tube placement. EXAM: PORTABLE ABDOMEN - 1 VIEW COMPARISON:  None. FINDINGS: An OG tube is identified with tip coiled  overlying the proximal stomach. The bowel gas pattern is unremarkable. No acute bony abnormalities are present. IMPRESSION: OG tube with tip coiled overlying the proximal stomach. Electronically Signed   By: Harmon Pier M.D.   On: 05/28/2015 13:55    Medications:  Scheduled: . antiseptic oral rinse  7 mL Mouth Rinse 10 times per day  . chlorhexidine gluconate  15 mL Mouth Rinse BID  . famotidine (PEPCID) IV  20 mg Intravenous Q12H  . Influenza vac split quadrivalent PF  0.5 mL Intramuscular Tomorrow-1000  . insulin aspart  0-9 Units Subcutaneous 6 times per day  . levETIRAcetam  1,000 mg Intravenous Q12H  . magnesium  sulfate 1 - 4 g bolus IVPB  2 g Intravenous Once  . pantoprazole sodium  40 mg Per Tube Q1200  . valproate sodium  500 mg Intravenous 3 times per day    Felicie Morn PA-C [facility] [phone]  05/29/2015, 9:57 AM  Patient seen and examined.  Clinical course and management discussed.  Necessary edits performed.  I agree with the above.  Assessment and plan of care developed and discussed below.    Assessment/Plan: Patient admitted s/p fall and arrest.  Remains unresponsive.  No further myoclonic activity noted.  Concern is for hypoperfusion injury.  LFT and renal function elevated as well.  Family made aware of poor prognosis.  Patient has been made a DNR.  LTM shows no evidence of seizure activity.  On Depakote-level pending.  Recommendations: 1.  D/C LTM 2.  Repeat head CT without contrast.     Thana Farr, MD [facility] [phone]  05/29/2015  1:10 PM

## 2015-05-29 NOTE — Procedures (Signed)
24 hour video long term monitoring study.   Patient Name: Christopher Barber  Date of Birth  1951/08/01  Referring Physician: Ennis FortsPeter Somner  EEG Number [id number]  History: 64 year old s/p cardiac arrest, with twitching  Current Medications: Fentanyl, Depakote and Keppra  EEG Description: This is a 24 hour video long term monitoring study. EEG is obtained using the international 10-20 system of electrode placement. This dictation covers day 1 of the patient's recording, which starts at 05/28/15 at 2:55 PM, and ends at 05/29/15 at 7:27 AM The study is reviewed in its entirety. At times there is a very low at 2-0.5 Hz activity seen in the background, before large portion there is complete attenuation without any discernible cerebral electrical activity noted. There are no electrographic seizures or paroxysmal discharges noted.  Interpretation: This is an abnormal EEG. EEG is consistent with a very severe encephalopathy of nonspecific etiology, there are no electrographic seizures noted in this portion of the recording.

## 2015-05-29 NOTE — Progress Notes (Deleted)
error 

## 2015-05-29 NOTE — Progress Notes (Signed)
Transported to CT without complication.  Returned to room on resting settings.

## 2015-05-29 NOTE — Progress Notes (Signed)
Pt's ALINE was not reading. Unable to draw sample from line. RN called Pola CornELINK and was told to pull ALINE.
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
## 2015-05-29 NOTE — Progress Notes (Signed)
LTVM [place] per Dr. Reynolds. No skin breakdown seen.  

## 2015-05-30 DIAGNOSIS — R7989 Other specified abnormal findings of blood chemistry: Secondary | ICD-10-CM

## 2015-05-30 DIAGNOSIS — J9601 Acute respiratory failure with hypoxia: Secondary | ICD-10-CM

## 2015-05-30 LAB — BASIC METABOLIC PANEL
ANION GAP: 6 (ref 5–15)
BUN: 29 mg/dL — ABNORMAL HIGH (ref 6–20)
CO2: 19 mmol/L — AB (ref 22–32)
CREATININE: 1.46 mg/dL — AB (ref 0.61–1.24)
Calcium: 6.2 mg/dL — CL (ref 8.9–10.3)
Chloride: 123 mmol/L — ABNORMAL HIGH (ref 101–111)
GFR, EST AFRICAN AMERICAN: 57 mL/min — AB (ref >60)
GFR, EST NON AFRICAN AMERICAN: 49 mL/min — AB (ref >60)
GLUCOSE: 96 mg/dL (ref 65–99)
Potassium: 3.6 mmol/L (ref 3.5–5.1)
Sodium: 148 mmol/L — ABNORMAL HIGH (ref 135–145)

## 2015-05-30 LAB — CBC
HCT: 28.4 % — ABNORMAL LOW (ref 39.0–52.0)
HCT: 21.4 % — ABNORMAL LOW (ref 39.0–52.0)
HEMOGLOBIN: 7.1 g/dL — AB (ref 13.0–17.0)
Hemoglobin: 9.6 g/dL — ABNORMAL LOW (ref 13.0–17.0)
MCH: 33.1 pg (ref 26.0–34.0)
MCH: 32.6 pg (ref 26.0–34.0)
MCHC: 33.8 g/dL (ref 30.0–36.0)
MCHC: 33.2 g/dL (ref 30.0–36.0)
MCV: 97.9 fL (ref 78.0–100.0)
MCV: 98.2 fL (ref 78.0–100.0)
PLATELETS: 105 10*3/uL — AB (ref 150–400)
Platelets: 112 10*3/uL — ABNORMAL LOW (ref 150–400)
RBC: 2.9 MIL/uL — AB (ref 4.22–5.81)
RBC: 2.18 MIL/uL — ABNORMAL LOW (ref 4.22–5.81)
RDW: 14.6 % (ref 11.5–15.5)
RDW: 14.6 % (ref 11.5–15.5)
WBC: 8.5 10*3/uL (ref 4.0–10.5)
WBC: 8.5 10*3/uL (ref 4.0–10.5)

## 2015-05-30 LAB — GLUCOSE, CAPILLARY
GLUCOSE-CAPILLARY: 126 mg/dL — AB (ref 65–99)
GLUCOSE-CAPILLARY: 88 mg/dL (ref 65–99)
GLUCOSE-CAPILLARY: 91 mg/dL (ref 65–99)
GLUCOSE-CAPILLARY: 97 mg/dL (ref 65–99)
GLUCOSE-CAPILLARY: 99 mg/dL (ref 65–99)
Glucose-Capillary: 106 mg/dL — ABNORMAL HIGH (ref 65–99)

## 2015-05-30 LAB — BLOOD GAS, ARTERIAL
Acid-base deficit: 8.5 mmol/L — ABNORMAL HIGH (ref 0.0–2.0)
Bicarbonate: 15.4 mEq/L — ABNORMAL LOW (ref 20.0–24.0)
Drawn by: 331761
FIO2: 0.3
O2 Saturation: 98.5 %
PATIENT TEMPERATURE: 100.4
PEEP/CPAP: 5 cmH2O
PO2 ART: 124 mmHg — AB (ref 80.0–100.0)
PRESSURE SUPPORT: 5 cmH2O
TCO2: 16.1 mmol/L (ref 0–100)
pCO2 arterial: 26.3 mmHg — ABNORMAL LOW (ref 35.0–45.0)
pH, Arterial: 7.39 (ref 7.350–7.450)

## 2015-05-30 LAB — HEMOGLOBIN AND HEMATOCRIT, BLOOD
HEMATOCRIT: 27.4 % — AB (ref 39.0–52.0)
HEMOGLOBIN: 9.1 g/dL — AB (ref 13.0–17.0)

## 2015-05-30 NOTE — Progress Notes (Signed)
Subjective: Patient remains unresponsive.  No further myoclonic activity noted.  Family reports that on yesterday patient while being spoken to by family had eye fluttering then opened his eyes and attempted to speak.  No further such activity has been noted.   Objective: Current vital signs: BP 143/77 mmHg  Pulse 69  Temp(Src) 99.5 F (37.5 C) (Core (Comment))  Resp 24  Ht  (1.803 m)  Wt 91.2 kg (201 lb 1 oz)  BMI 28.05 kg/m2  SpO2 100% Vital signs in last 24 hours: Temp:  [99.1 F (37.3 C)-100 F (37.8 C)] 99.5 F (37.5 C) (10/25 0800) Pulse Rate:  [66-114] 69 (10/25 0800) Resp:  [0-26] 24 (10/25 0800) BP: (108-161)/(63-86) 143/77 mmHg (10/25 0800) SpO2:  [98 %-100 %] 100 % (10/25 0800) FiO2 (%):  [30 %] 30 % (10/25 0800) Weight:  [91.2 kg (201 lb 1 oz)] 91.2 kg (201 lb 1 oz) (10/25 0427)  Intake/Output from previous day: 10/24 0701 - 10/25 0700 In: 2436.3 [I.V.:1956.3; IV Piggyback:480] Out: 1030 [Urine:850; Stool:180] Intake/Output this shift: Total I/O In: 170.8 [I.V.:170.8] Out: 50 [Urine:50] Nutritional status: Diet NPO time specified  Neurologic Exam: Mental Status: Patient does not respond to verbal stimuli.  Does not respond to deep sternal rub eyes opened with pupils deviating upward.  Does not follow commands.  No verbalizations noted.  Cranial Nerves: II: patient does not respond confrontation bilaterally, pupils right 3 mm, left 3 mm,and reactive bilaterally III,IV,VI: doll's response present bilaterally.  V,VII: corneal reflex present bilaterally  VIII: patient does not respond to verbal stimuli IX,X: gag reflex unable to test due to intubation, XI: trapezius strength unable to test bilaterally XII: tongue strength unable to test Motor: Extremities flaccid throughout.  No spontaneous movement noted.  No purposeful movements noted. Sensory: Does not respond to noxious stimuli in any extremity. Plantars: Absent on the right and upgoing on the  left Cerebellar: Unable to perform    Lab Results: Basic Metabolic Panel:  Recent Labs Lab 05/22/2015 2245 05/28/15 0004 05/28/15 0420 05/28/15 1445 05/29/15 0600  NA 141 146* 139 142 145  K 3.6 3.0* 4.3 3.9 3.9  CL 110 111 112* 116* 122*  CO2 19*  --  9* 17* 18*  GLUCOSE 166* 107* 179* 163* 109*  BUN 19 22* 20 24* 26*  CREATININE 1.44* 1.50* 1.36* 1.78* 1.99*  CALCIUM 8.6*  --  6.3* 6.9* 6.2*  MG 2.7*  --  1.3* 1.4* 1.3*  PHOS  --   --  4.3  --   --     Liver Function Tests:  Recent Labs Lab 05/09/2015 2245  AST 309*  ALT 174*  ALKPHOS 74  BILITOT 0.4  PROT 6.0*  ALBUMIN 3.5   No results for input(s): LIPASE, AMYLASE in the last 168 hours. No results for input(s): AMMONIA in the last 168 hours.  CBC:  Recent Labs Lab 05/07/2015 2245 05/28/15 0004 05/28/15 0630 05/29/15 0600  WBC 9.8  --  20.9* 8.5  HGB 13.2 16.3 12.3* 9.6*  HCT 41.6 48.0 36.7* 28.4*  MCV 101.7*  --  97.9 97.9  PLT 244  --  241 112*    Cardiac Enzymes:  Recent Labs Lab 05/28/15 0400 05/28/15 0815 05/28/15 1445  TROPONINI 0.06* 0.10* 0.07*    Lipid Panel: No results for input(s): CHOL, TRIG, HDL, CHOLHDL, VLDL, LDLCALC in the last 168 hours.  CBG:  Recent Labs Lab 05/29/15 1533 05/29/15 1957 05/29/15 2334 05/30/15 0416 05/30/15 0806  GLUCAP 115* 121*  126* 97 99    Microbiology: Results for orders placed or performed during the hospital encounter of 08/02/2015  Urine culture     Status: None   Collection Time: 08/02/2015 10:15 PM  Result Value Ref Range Status   Specimen Description URINE, CATHETERIZED  Final   Special Requests NONE  Final   Culture NO GROWTH 1 DAY  Final   Report Status 05/29/2015 FINAL  Final  MRSA PCR Screening     Status: None   Collection Time: 05/28/15  3:39 AM  Result Value Ref Range Status   MRSA by PCR NEGATIVE NEGATIVE Final    Comment:        The GeneXpert MRSA Assay (FDA approved for NASAL specimens only), is one component of  a comprehensive MRSA colonization surveillance program. It is not intended to diagnose MRSA infection nor to guide or monitor treatment for MRSA infections.     Coagulation Studies:  Recent Labs  05/28/15 0400  LABPROT 16.2*  INR 1.28    Imaging: Ct Head Wo Contrast  05/29/2015  CLINICAL DATA:  64 year old male is intubated, remains unresponsive. Found down. Status post arrest. Initial encounter. EXAM: CT HEAD WITHOUT CONTRAST TECHNIQUE: Contiguous axial images were obtained from the base of the skull through the vertex without intravenous contrast. COMPARISON:  Head CT without contrast 01-30-15. FINDINGS: The patient remains intubated as seen on the scout view. Mildly increased fluid in mucosal thickening in the paranasal sinuses. Stable fluid in the pharynx. Tympanic cavities and mastoids remain clear. Broad-based left eccentric posterior scalp hematoma is increased (series 3, image 73). Underlying calvarium intact. Nearby skin staples now in place. No acute osseous abnormality identified. Ventricle size remains normal. No intracranial mass effect or midline shift. Gray-white matter differentiation appears stable in both hemispheres. Minimal nonspecific white matter hypodensity in the left frontal lobe (series 2, image 22). Mildly increased extra-axial CSF density bilaterally (series 2, image 21). No hyperdense intracranial blood products. No suspicious intracranial vascular hyperdensity. IMPRESSION: 1. Increased small volume extra-axial CSF which might be posttraumatic CSF hygromas, otherwise stable noncontrast CT appearance of the head since 01-30-15, with no infarct or intracranial hemorrhage identified. 2. Left posterior scalp soft tissue injury with increased mild scalp hematoma. No acute osseous abnormality identified. Electronically Signed   By: Odessa FlemingH  Hall M.D.   On: 05/29/2015 15:09   Dg Chest Port 1 View  05/29/2015  CLINICAL DATA:  Hypoxia EXAM: PORTABLE CHEST 1 VIEW  COMPARISON:  May 28, 2015 FINDINGS: Endotracheal tube tip is 5.3 cm above the carina. Nasogastric tube tip and side port are in the stomach. Central catheter tip is in the superior vena cava. No pneumothorax. There is atelectasis in the right base. Lungs elsewhere clear. Heart size and pulmonary vascularity are normal. No adenopathy. IMPRESSION: Tube and catheter positions as described without pneumothorax. Right base atelectasis. Lungs elsewhere clear. Electronically Signed   By: Bretta BangWilliam  Woodruff III M.D.   On: 05/29/2015 10:49   Dg Abd Portable 1v  05/28/2015  CLINICAL DATA:  64 year old male-feeding tube placement. EXAM: PORTABLE ABDOMEN - 1 VIEW COMPARISON:  None. FINDINGS: An OG tube is identified with tip coiled overlying the proximal stomach. The bowel gas pattern is unremarkable. No acute bony abnormalities are present. IMPRESSION: OG tube with tip coiled overlying the proximal stomach. Electronically Signed   By: Harmon PierJeffrey  Hu M.D.   On: 05/28/2015 13:55    Medications:  I have reviewed the patient's current medications. Scheduled: . antiseptic oral rinse  7 mL  Mouth Rinse 10 times per day  . chlorhexidine gluconate  15 mL Mouth Rinse BID  . famotidine (PEPCID) IV  20 mg Intravenous Q12H  . insulin aspart  0-9 Units Subcutaneous 6 times per day  . levETIRAcetam  1,000 mg Intravenous Q12H  . pantoprazole sodium  40 mg Per Tube Q1200  . valproate sodium  500 mg Intravenous 3 times per day    Assessment/Plan: Patient remains unresponsive but without clinical signs of brain death.  No further myoclonic activity noted.  Remains on Keppra and Depakote.  Last Depakote level on yesterday was 57.  Prognosis discussed with wife.   Recommendations: 1.  Continue AED's at current doses   2.  Further family members to arrive this evening.  Decision to be made about continuation of care versus withdrawal of ventilatory support.     LOS: 2 days   Thana Farr, MD [facility] [phone] 05/30/2015  9:40 AM
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
## 2015-05-30 NOTE — Progress Notes (Signed)
CRITICAL VALUE ALERT  Critical value received:  Calcium  Date of notification:  05/30/15  Time of notification:  1749  Critical value read back:Yes.    Nurse who received alert:  Rulon EisenmengerHeather Severiano Utsey, RN   Responding MD: Dr. Craige CottaSood  Time MD responded:  [id number]
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
## 2015-05-31 DIAGNOSIS — G931 Anoxic brain damage, not elsewhere classified: Secondary | ICD-10-CM

## 2015-05-31 LAB — CBC
HEMATOCRIT: 28 % — AB (ref 39.0–52.0)
Hemoglobin: 9.2 g/dL — ABNORMAL LOW (ref 13.0–17.0)
MCH: 32.7 pg (ref 26.0–34.0)
MCHC: 32.9 g/dL (ref 30.0–36.0)
MCV: 99.6 fL (ref 78.0–100.0)
PLATELETS: 94 10*3/uL — AB (ref 150–400)
RBC: 2.81 MIL/uL — ABNORMAL LOW (ref 4.22–5.81)
RDW: 14.6 % (ref 11.5–15.5)
WBC: 6.9 10*3/uL (ref 4.0–10.5)

## 2015-05-31 LAB — BLOOD GAS, ARTERIAL
Acid-base deficit: 7.6 mmol/L — ABNORMAL HIGH (ref 0.0–2.0)
BICARBONATE: 16.3 meq/L — AB (ref 20.0–24.0)
Drawn by: 312761
FIO2: 0.3
O2 Saturation: 98 %
PATIENT TEMPERATURE: 98.6
PEEP: 5 cmH2O
PO2 ART: 103 mmHg — AB (ref 80.0–100.0)
Pressure support: 10 cmH2O
TCO2: 17.1 mmol/L (ref 0–100)
pCO2 arterial: 26.8 mmHg — ABNORMAL LOW (ref 35.0–45.0)
pH, Arterial: 7.401 (ref 7.350–7.450)

## 2015-05-31 LAB — COMPREHENSIVE METABOLIC PANEL
ALBUMIN: 2.2 g/dL — AB (ref 3.5–5.0)
ALT: 78 U/L — ABNORMAL HIGH (ref 17–63)
AST: 74 U/L — AB (ref 15–41)
Alkaline Phosphatase: 53 U/L (ref 38–126)
Anion gap: 6 (ref 5–15)
BUN: 27 mg/dL — AB (ref 6–20)
CHLORIDE: 118 mmol/L — AB (ref 101–111)
CO2: 20 mmol/L — ABNORMAL LOW (ref 22–32)
Calcium: 6.1 mg/dL — CL (ref 8.9–10.3)
Creatinine, Ser: 1.36 mg/dL — ABNORMAL HIGH (ref 0.61–1.24)
GFR calc Af Amer: >60 mL/min (ref >60)
GFR calc non Af Amer: 54 mL/min — ABNORMAL LOW (ref >60)
GLUCOSE: 92 mg/dL (ref 65–99)
POTASSIUM: 3.6 mmol/L (ref 3.5–5.1)
SODIUM: 144 mmol/L (ref 135–145)
Total Bilirubin: 0.9 mg/dL (ref 0.3–1.2)
Total Protein: 4.2 g/dL — ABNORMAL LOW (ref 6.5–8.1)

## 2015-05-31 LAB — GLUCOSE, CAPILLARY
GLUCOSE-CAPILLARY: 91 mg/dL (ref 65–99)
GLUCOSE-CAPILLARY: 88 mg/dL (ref 65–99)
GLUCOSE-CAPILLARY: 91 mg/dL (ref 65–99)
Glucose-Capillary: 93 mg/dL (ref 65–99)

## 2015-05-31 MED ORDER — MORPHINE SULFATE 25 MG/ML IV SOLN
10.0000 mg/h | INTRAVENOUS | Status: DC
  Administered 2015-05-31: 2 mg/h via INTRAVENOUS
  Filled 2015-05-31: qty 10

## 2015-05-31 MED ORDER — MORPHINE BOLUS VIA INFUSION
5.0000 mg | INTRAVENOUS | Status: DC | PRN
  Filled 2015-05-31: qty 20

## 2015-06-02 ENCOUNTER — Telehealth: Payer: Self-pay

## 2015-06-02 NOTE — Telephone Encounter (Signed)
On 06/02/2015 I received a death certificate from [employer]. The death certificate is for cremation. The patient is patient of Doctor Byrum. The death certificate was faxed to the [place] location so that Doctor Byrum could sign it since this was a faxed copy. On 06/06/2015 I received the death certificate back from Doctor Byrum. I got the death certificate ready and called the funeral home to let them know the death certificate is ready for pickup.

## 2015-06-05 ENCOUNTER — Telehealth: Payer: Self-pay

## 2015-06-05 NOTE — Telephone Encounter (Signed)
On 06/05/2015 I received the original death certificate from [employer]. The death certificate is for cremation. The patient is a patient of Doctor Byrum. [id number] this pm. On 06/06/2015 I received the death certificate back from Doctor Byrum. I got the death certificate ready for pickup and called the funeral home to let them know the death certificate is ready for pickup.

## 2015-06-06 NOTE — Procedures (Signed)
Extubation Procedure Note  Patient Details:   Name: Christopher Barber DOB: June 20, 1951 MRN: [id number]   Pt extubated at this time per MD order.   Cherylin MylarDoyle, Ayvah Caroll 05/12/2015, 1:37 PM

## 2015-06-06 NOTE — Progress Notes (Signed)
   05/11/2015 1500  Clinical Encounter Type  Visited With Family  Visit Type Initial;Spiritual support  Referral From Nurse  Spiritual Encounters  Spiritual Needs Prayer  Chaplain visited with Pt and family; Chaplain desire to return to pray with family; Chaplain can return upon request

## 2015-06-06 NOTE — Progress Notes (Signed)
PULMONARY / CRITICAL CARE MEDICINE   Name: Christopher Barber MRN: [id number] DOB: 08/09/1950    ADMISSION DATE:  06/21/2015 CONSULTATION DATE:  06-21-15  REFERRING MD :  Dr. Celene Kras  CHIEF COMPLAINT:  S/p cardiac arrest  INITIAL PRESENTATION: ED  BRIEF SUMMARY: 64 year old Caucasian male past medical history significant for hypertension, anxiety, alcohol abuse who was admitted on 10/23 after suffering a cardiac arrest.  The patient was intoxicated, fell and hit the back of his head against the wheel of the car.  Wife present and witnessed collapse and reported no breathin - EMS arrived 5 min after arrest.  Received ~ 15 min of CPR with epi.  Initial rhythm was PEA.    SIGNIFICANT EVENTS:  10/23  Admit after PEA arrest 15 min CPR + 5 min downtime prior to event  STUDIES:  CT Head / Cervical Spine 10/22 >> no evidence of traumatic injury/fracture, soft tissue laceration along posterior upper neck with associated soft tissue air, mild small vessel ischemic microangiopathy, minimal opacification of R maxillary sinus, fusion C5-7 CT Head 10/24 >> increased small volume extra-axial CSF, ? Post-traumatic. No evidence evolving CVA or anoxic changes.   SUBJECTIVE:   No significant clinical change. No notable myoclonus off sedation Tolerates PSV   VITAL SIGNS: Temp:  [99.1 F (37.3 C)-101.1 F (38.4 C)] 100.9 F (38.3 C) (10/26 0900) Pulse Rate:  [56-100] 80 (10/26 0900) Resp:  [0-27] 18 (10/26 0900) BP: (86-140)/(39-98) 137/69 mmHg (10/26 0900) SpO2:  [97 %-100 %] 100 % (10/26 0900) FiO2 (%):  [30 %] 30 % (10/26 0900) Weight:  [205 lb 4 oz (93.1 kg)] 205 lb 4 oz (93.1 kg) (10/26 0500) HEMODYNAMICS: CVP:  [4 mmHg-11 mmHg] 8 mmHg VENTILATOR SETTINGS: Vent Mode:  [-] PSV FiO2 (%):  [30 %] 30 % PEEP:  [5 cmH20] 5 cmH20 Pressure Support:  [5 cmH20-10 cmH20] 10 cmH20 INTAKE / OUTPUT:  Intake/Output Summary (Last 24 hours) at 05/10/2015 0936 Last data filed at 05/19/2015 0900  Gross per  24 hour  Intake 2353.43 ml  Output   1070 ml  Net 1283.43 ml    PHYSICAL EXAMINATION: General: wdwn adult male on vent, no more myoclonic movements Neuro: no response to stimuli, pupils responsive 2mm, plantar flexion of feet / concern for decerebrate posturing on 10/23, no gag CV: s1s2 rrr, tachy, no m/r/g PULM: even/non-labored, lungs bilaterally clear, OETT in place GI: soft/bsx4 active  Extremities: warm/dry, sweating   LABS:  CBC  Recent Labs Lab 05/29/15 0600 05/30/15 1722 05/30/15 2305 05/10/2015 0545  WBC 8.5 8.5  --  6.9  HGB 9.6* 7.1* 9.1* 9.2*  HCT 28.4* 21.4* 27.4* 28.0*  PLT 112* 105*  --  94*   Coag's  Recent Labs Lab 05/28/15 0400  INR 1.28   BMET  Recent Labs Lab 05/29/15 0600 05/30/15 1722 05/17/2015 0545  NA 145 148* 144  K 3.9 3.6 3.6  CL 122* 123* 118*  CO2 18* 19* 20*  BUN 26* 29* 27*  CREATININE 1.99* 1.46* 1.36*  GLUCOSE 109* 96 92   Electrolytes  Recent Labs Lab 05/28/15 0420 05/28/15 1445 05/29/15 0600 05/30/15 1722 05/11/2015 0545  CALCIUM 6.3* 6.9* 6.2* 6.2* 6.1*  MG 1.3* 1.4* 1.3*  --   --   PHOS 4.3  --   --   --   --    Sepsis Markers  Recent Labs Lab 05/28/15 0700 05/28/15 1104 05/29/15 0030  LATICACIDVEN 5.6* 5.1* 5.0*   ABG  Recent Labs Lab 05/29/15  0315 05/30/15 1207 06-21-15 0345  PHART 7.398 7.390 7.401  PCO2ART 23.0* 26.3* 26.8*  PO2ART 127* 124* 103*   Liver Enzymes  Recent Labs Lab 05/28/2015 2245 June 21, 2015 0545  AST 309* 74*  ALT 174* 78*  ALKPHOS 74 53  BILITOT 0.4 0.9  ALBUMIN 3.5 2.2*   Cardiac Enzymes  Recent Labs Lab 05/28/15 0400 05/28/15 0815 05/28/15 1445  TROPONINI 0.06* 0.10* 0.07*   Glucose  Recent Labs Lab 05/30/15 1150 05/30/15 1548 05/30/15 1948 05/30/15 2341 2015-06-21 0354 21-Jun-2015 0732  GLUCAP 106* 91 91 88 88 91    Imaging No results found.   ASSESSMENT / PLAN:  PULMONARY OETT 10/22 >>  A:  Acute Hypoxemic Respiratory Failure - in setting of  cardiac arrest, lack of airway protection  Tobacco Abuse  P:   Will change to PSV as primary vent mode given frequent double breaths. Should be able to continue this as long as he doesn't require re-sedation.  Wean PEEP / FiO2 for sats > 90% Trend CXR  PRN albuterol with hx of smoking   CARDIOVASCULAR CVL R SQ 10/23 >>  A:  Status post cardiac arrest - ? Hypoxic etiology with PEA arrest  Shock - in setting of cardiac arrest, hypothermia.  Resolved  Tachycardia  P:  Change BP goal to MAP > 65 on 10/25  RENAL Lab Results  Component Value Date   CREATININE 1.36* 06/21/15   CREATININE 1.46* 05/30/2015   CREATININE 1.99* 05/29/2015   CREATININE 1.40* 02/28/2014    A:   AKI - in setting of cardiac arrest  Metabolic Acidosis Hypocalcemia Hypomagnesemia  P:   Trend BMP / UOP  Replace electrolytes as indicated  IVF to NS @ 75    GASTROINTESTINAL A:   Transaminitis - likely secondary to alcohol, AST to ALT ratio of approximately 2-1, +/- shock liver component Dark NGT drainage - ? Gastritis associated with chronic ETOH use P:   Trend LFT's  NPO OGT  PPI   HEMATOLOGIC A:   Macrocytic Anemia Thrombocytopenia  P:  Stop Lovenox and follow plt on 10/24 Monitor for bleeding  Assess stool for FOB   INFECTIOUS A:   At Risk Aspiration - in setting of arrest  P:   Monitoring off abx  Trend CXR Follow fever curve / wbc  ENDOCRINE CBG (last 3)   Recent Labs  05/30/15 2341 06-21-15 0354 2015/06/21 0732  GLUCAP 88 88 91     A:   Hyperglycemia , resolved P:   SSI   NEUROLOGIC A:   Toxic metabolic encephalopathy secondary to cardiac arrest, potential anoxic brain injury Myoclonic Jerking - concern for anoxic injury  P:   Serial neuro exams. PRN ativan for myoclonic movements  Keppra  RASS goal: 0 Neurology following, I discussed case with Dr Thad Ranger 10/25. Family meet scheduled for 10/26    FAMILY  - Updates: Family updated at bedside 10/24.   Extensive discussion with family regarding neuro exam.  Explained that he will likely not recover, that we need to follow neuro status for another 24h to give more definitive advice. If consensus is that he cannot improve then family confirms that he would not want prolonged care but instead would want comfort. I confirmed with them that he will be no CPR in the meantime.  10/26 family meet planned  - Inter-disciplinary family meet or Palliative Care meeting due by: 10/30  Brett Canales Minor ACNP [facility] Pager [phone] till 3 pm If no answer page [phone]  05/07/2015, 9:36 AM   Attending Note:  I have examined patient, reviewed labs, studies and notes. I have discussed the case with S minor, and I agree with the data and plans as amended above. Also discussed case with family and with Dr Thad Rangereynolds. Prognosis for meaningful survival grim. Based on family discussions with neurology I will enter withdrawal of care orders.   Levy Pupaobert Ladashia Demarinis, MD, PhD 05/19/2015, 11:49 AM [facility] [phone] or if no answer [phone]

## 2015-06-06 NOTE — Progress Notes (Signed)
Subjective: Patient remains unresponsive.  Nursing reports some posturing overnight.  Objective: Current vital signs: BP 132/73 mmHg  Pulse 94  Temp(Src) 100.5 F (38.1 C) (Core (Comment))  Resp 26  Ht 5\' 11"  (1.803 m)  Wt 93.1 kg (205 lb 4 oz)  BMI 28.64 kg/m2  SpO2 93% Vital signs in last 24 hours: Temp:  [99.5 F (37.5 C)-101.1 F (38.4 C)] 100.5 F (38.1 C) (10/26 1500) Pulse Rate:  [56-99] 94 (10/26 1500) Resp:  [0-29] 26 (10/26 1500) BP: (86-139)/(44-76) 132/73 mmHg (10/26 1300) SpO2:  [93 %-100 %] 93 % (10/26 1500) FiO2 (%):  [30 %] 30 % (10/26 1222) Weight:  [93.1 kg (205 lb 4 oz)] 93.1 kg (205 lb 4 oz) (10/26 0500)  Intake/Output from previous day: 10/25 0701 - 10/26 0700 In: 2372.2 [I.V.:1832.2; IV Piggyback:540] Out: 1020 [Urine:895; Stool:125] Intake/Output this shift: Total I/O In: 637.3 [I.V.:477.3; IV Piggyback:160] Out: 100 [Urine:100] Nutritional status:    Neurologic Exam: Mental Status: Patient does not respond to verbal stimuli. Some posturing noted of the RUE with sternal rub but no purposeful movements noted. Does not follow commands. No verbalizations noted.  Cranial Nerves: II: patient does not respond confrontation bilaterally, pupils right 3 mm, left 3 mm,and reactive bilaterally III,IV,VI: doll's response present bilaterally.  V,VII: corneal reflex present bilaterally  VIII: patient does not respond to verbal stimuli IX,X: gag reflex unable to test due to intubation, XI: trapezius strength unable to test bilaterally XII: tongue strength unable to test Motor: Extremities flaccid throughout. No spontaneous movement noted. No purposeful movements noted. Sensory: Does not respond to noxious stimuli in any extremity. Plantars: Absent on the right and upgoing on the left Cerebellar: Unable to perform  Lab Results: Basic Metabolic Panel:  Recent Labs Lab 05/26/2015 2245  05/28/15 0420 05/28/15 1445 05/29/15 0600 05/30/15 1722  2014-12-04 0545  NA 141  < > 139 142 145 148* 144  K 3.6  < > 4.3 3.9 3.9 3.6 3.6  CL 110  < > 112* 116* 122* 123* 118*  CO2 19*  --  9* 17* 18* 19* 20*  GLUCOSE 166*  < > 179* 163* 109* 96 92  BUN 19  < > 20 24* 26* 29* 27*  CREATININE 1.44*  < > 1.36* 1.78* 1.99* 1.46* 1.36*  CALCIUM 8.6*  --  6.3* 6.9* 6.2* 6.2* 6.1*  MG 2.7*  --  1.3* 1.4* 1.3*  --   --   PHOS  --   --  4.3  --   --   --   --   < > = values in this interval not displayed.  Liver Function Tests:  Recent Labs Lab 05/11/2015 2245 2014-12-04 0545  AST 309* 74*  ALT 174* 78*  ALKPHOS 74 53  BILITOT 0.4 0.9  PROT 6.0* 4.2*  ALBUMIN 3.5 2.2*   No results for input(s): LIPASE, AMYLASE in the last 168 hours. No results for input(s): AMMONIA in the last 168 hours.  CBC:  Recent Labs Lab 05/08/2015 2245  05/28/15 0630 05/29/15 0600 05/30/15 1722 05/30/15 2305 2014-12-04 0545  WBC 9.8  --  20.9* 8.5 8.5  --  6.9  HGB 13.2  < > 12.3* 9.6* 7.1* 9.1* 9.2*  HCT 41.6  < > 36.7* 28.4* 21.4* 27.4* 28.0*  MCV 101.7*  --  97.9 97.9 98.2  --  99.6  PLT 244  --  241 112* 105*  --  94*  < > = values in this interval not displayed.  Cardiac Enzymes:  Recent Labs Lab 05/28/15 0400 05/28/15 0815 05/28/15 1445  TROPONINI 0.06* 0.10* 0.07*    Lipid Panel: No results for input(s): CHOL, TRIG, HDL, CHOLHDL, VLDL, LDLCALC in the last 168 hours.  CBG:  Recent Labs Lab 05/30/15 1948 05/30/15 2341 05/30/2015 0354 05/08/2015 0732 05/07/2015 1135  GLUCAP 91 88 88 91 93    Microbiology: Results for orders placed or performed during the hospital encounter of 13-Jun-2015  Urine culture     Status: None   Collection Time: 06-13-15 10:15 PM  Result Value Ref Range Status   Specimen Description URINE, CATHETERIZED  Final   Special Requests NONE  Final   Culture NO GROWTH 1 DAY  Final   Report Status 05/29/2015 FINAL  Final  MRSA PCR Screening     Status: None   Collection Time: 05/28/15  3:39 AM  Result Value Ref Range  Status   MRSA by PCR NEGATIVE NEGATIVE Final    Comment:        The GeneXpert MRSA Assay (FDA approved for NASAL specimens only), is one component of a comprehensive MRSA colonization surveillance program. It is not intended to diagnose MRSA infection nor to guide or monitor treatment for MRSA infections.     Coagulation Studies: No results for input(s): LABPROT, INR in the last 72 hours.  Imaging: No results found.  Medications: I have reviewed the patient's current medications. Scheduled:   Assessment/Plan: Patient remains unresponsive.  Poor likelihood of functional recovery.  45 minute conversation had with all family members concerning patient's current state and prognosis.  Family made decision to withdraw care.  Discussion had with critical care.  Family refused chaplain services.  Recommendations: 1.  Extubation and comfort care    LOS: 3 days   Thana Farr, MD [facility] [phone] 05/09/2015  4:10 PM

## 2015-06-06 NOTE — Progress Notes (Signed)
Patient expired 1645.  Family at bedside and notified.  No heart or lungs sounds auscultated.  Verified by 2 RNs.

## 2015-06-06 DEATH — deceased

## 2015-06-07 MED FILL — Medication: Qty: 1 | Status: AC

## 2015-06-09 ENCOUNTER — Telehealth: Payer: Self-pay | Admitting: Emergency Medicine

## 2015-06-09 NOTE — Telephone Encounter (Signed)
Called and spoke with Leanne ChangNicole Nicole stated she was checking on the status of FMLA paperwork for pt's son She stated that paperwork was sent to office 06/05/15  Spoke with MR nurse and look through paperwork for RB and could not locate paperwork Called Joni Reiningicole back and informed that paperwork was not received by our office and asked to refax  Will keep message in triage until paperwork is received

## 2015-06-09 NOTE — Telephone Encounter (Signed)
Paperwork received and given to MR nurse for f/u  Will forward to AguilaElise to f/u on.

## 2015-06-09 NOTE — Telephone Encounter (Signed)
Form placed in MR's look-ats.

## 2015-06-12 NOTE — Telephone Encounter (Signed)
This has been returned back to me and placed in intra-office envelope. Nothing further needed at this time.

## 2015-06-12 NOTE — Telephone Encounter (Signed)
Elise - has this been done? 

## 2015-07-06 NOTE — Discharge Summary (Signed)
PULMONARY / CRITICAL CARE MEDICINE DEATH SUMMARY   Name: Christopher Barber MRN: [id number] DOB: 1950-11-06    ADMISSION DATE:  07/08/2015 CONSULTATION DATE:  01-02-2015 DATE OF DEATH: 05/23/2015  REFERRING MD :  Dr. Celene KrasBrooten  CHIEF COMPLAINT:  S/p cardiac arrest  FINAL CAUSE OF DEATH Anoxic encephalopathy  SECONDARY CAUSES OF DEATH Acute respiratory arrest Acute cardiac arrest Acute respiratory failure requiring mechanical ventilation Alcohol intoxication Acute Fall without evidence of head or C-spine traumatic injury Chronic alcohol abuse Tobacco abuse with suspected COPD Acute GI bleeding Suspected gastritis Acute renal failure Shock Transaminitis, possible shock liver versus alcoholic liver disease Metabolic acidosis Lactic acidosis Hypothermia Hypomagnesemia Hypocalcemia Thrombocytopenia Hyperglycemia Hx of HTN   HOSPITAL COURSE: [age] year old Caucasian male past medical history significant for hypertension, anxiety, alcohol abuse, tobacco abuse and probable COPD. He was admitted on 05/28/15 after suffering a cardiac arrest.  The patient was intoxicated, fell and hit the back of his head against the wheel of the car.  His wife present and witnessed the collapse, noted that he was apneic. EMS arrived 5 min after arrest, found him in PEA. He received ~ 15 min of CPR with epi. A CT head and C-spine showed no evidence of fracture, upper neck laceration. He was supported on MV, admitted to ICU. Course complicated by acute renal failure, shock liver with transaminitis. Also revealed acute on chronic anemia with suspected gastritis and possible UGI blood loss. He received supportive care. He developed myoclonus consistent with a severe anoxic brain injury. Given this and the associated poor prognosis for recovery decision was made by his family to withdraw mechanical ventilation and transition to comfort based care. This was done on 05/13/2015 and he died later the same day.    SIGNIFICANT EVENTS:  10/23  Admit after PEA arrest 15 min CPR + 5 min downtime prior to event  STUDIES:  CT Head / Cervical Spine 10/22 >> no evidence of traumatic injury/fracture, soft tissue laceration along posterior upper neck with associated soft tissue air, mild small vessel ischemic microangiopathy, minimal opacification of R maxillary sinus, fusion C5-7 CT Head 10/24 >> increased small volume extra-axial CSF, ? Post-traumatic. No evidence evolving CVA or anoxic changes.    [name], MD, PhD 06/08/2015, 11:53 AM  [facility] [phone] or if no answer [phone]

## 2017-05-26 IMAGING — CR DG CHEST 1V PORT
1 series · 1 of 1 positions shown · non-contrast
Comparison: Yesterday at 9297 hour

CLINICAL DATA: Central line placement.

EXAM:
PORTABLE CHEST 1 VIEW

[AP]
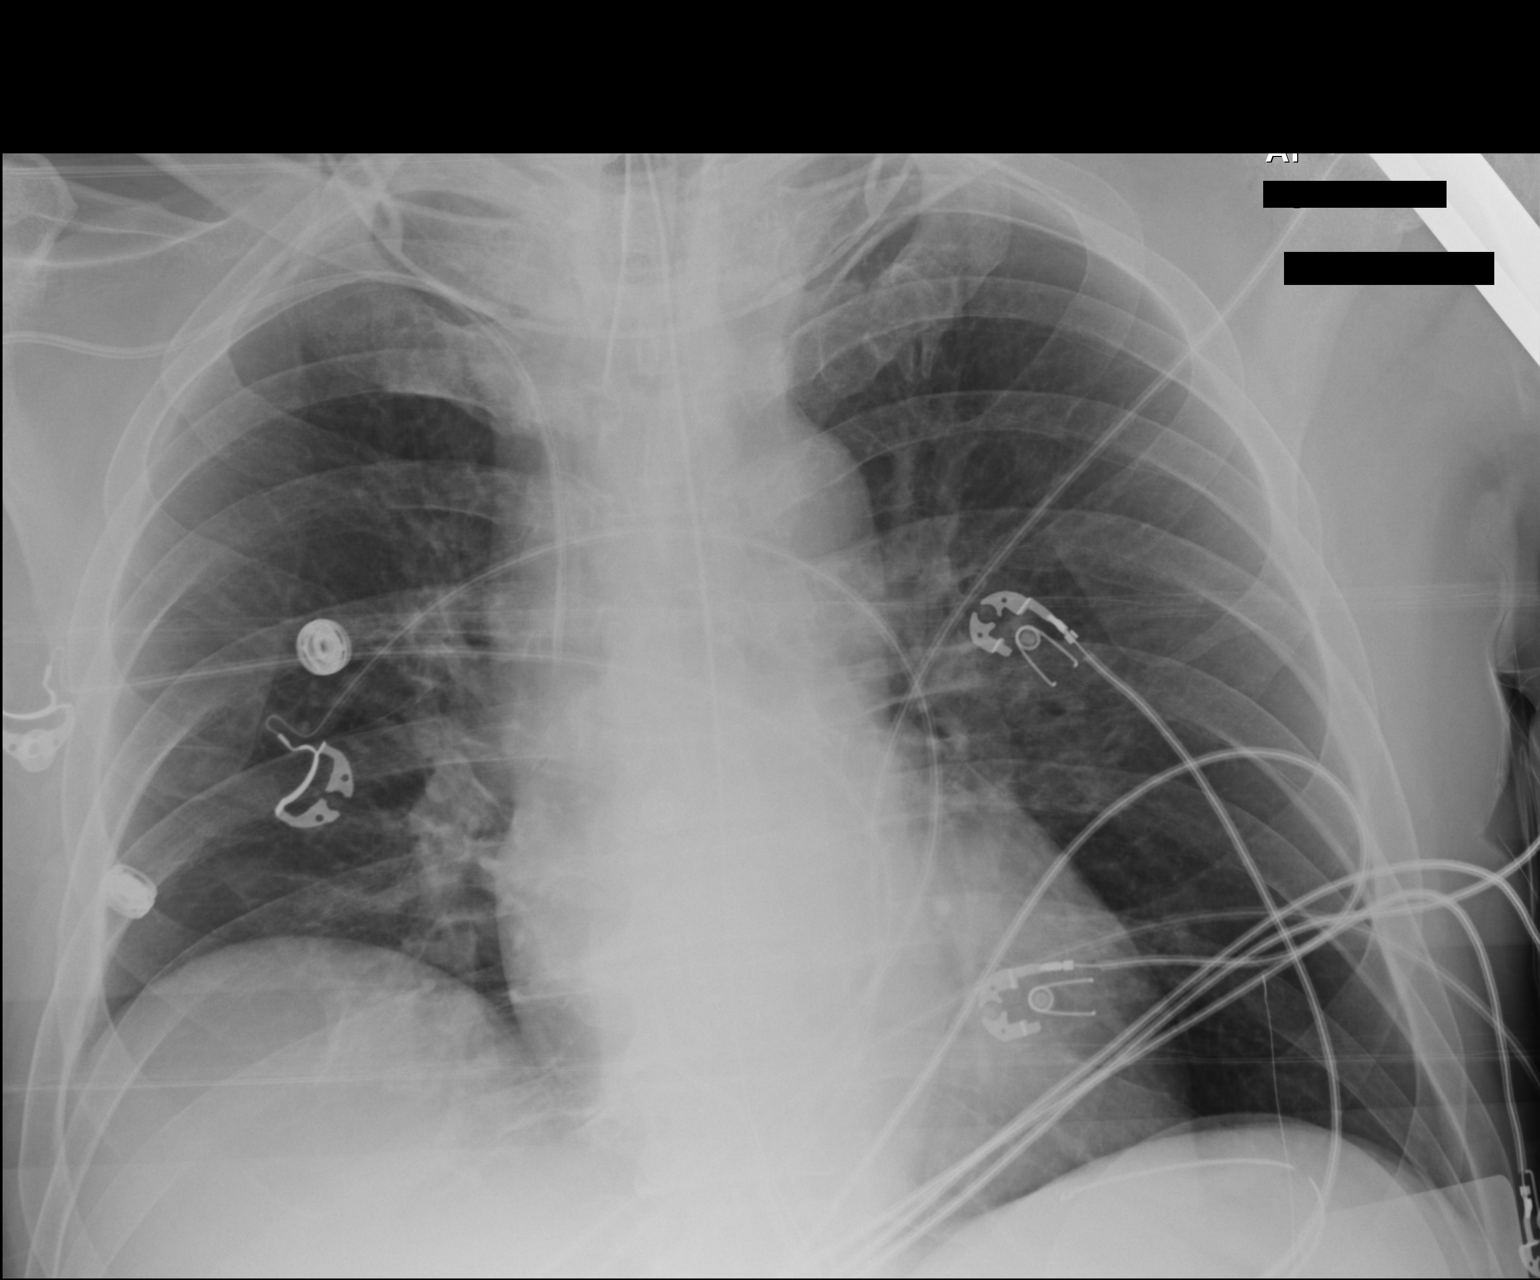

[1 of 1 positions shown; findings below may reference images not displayed]

FINDINGS: Endotracheal tube 5 cm from the carina. Enteric tube in place, tip
and side port below the diaphragm. New right subclavian central line
with tip in the SVC. No evidence of pneumothorax. Multiple overlying
monitoring devices again seen. Mild right basilar atelectasis is
unchanged. Diminished left basilar atelectasis. Cardiomediastinal
contours are unchanged. No new airspace disease. No pulmonary edema
or pleural effusion.
IMPRESSION: 1. Tip of the right central line in the mid SVC.  No pneumothorax.
2. Endotracheal and enteric tubes in place.
3. Mild right basilar atelectasis.

## 2017-05-27 IMAGING — CR DG CHEST 1V PORT
2 series · 2 of 2 positions shown · non-contrast
Comparison: May 28, 2015

CLINICAL DATA: Hypoxia

EXAM:
PORTABLE CHEST 1 VIEW

[AP (1 of 2)]
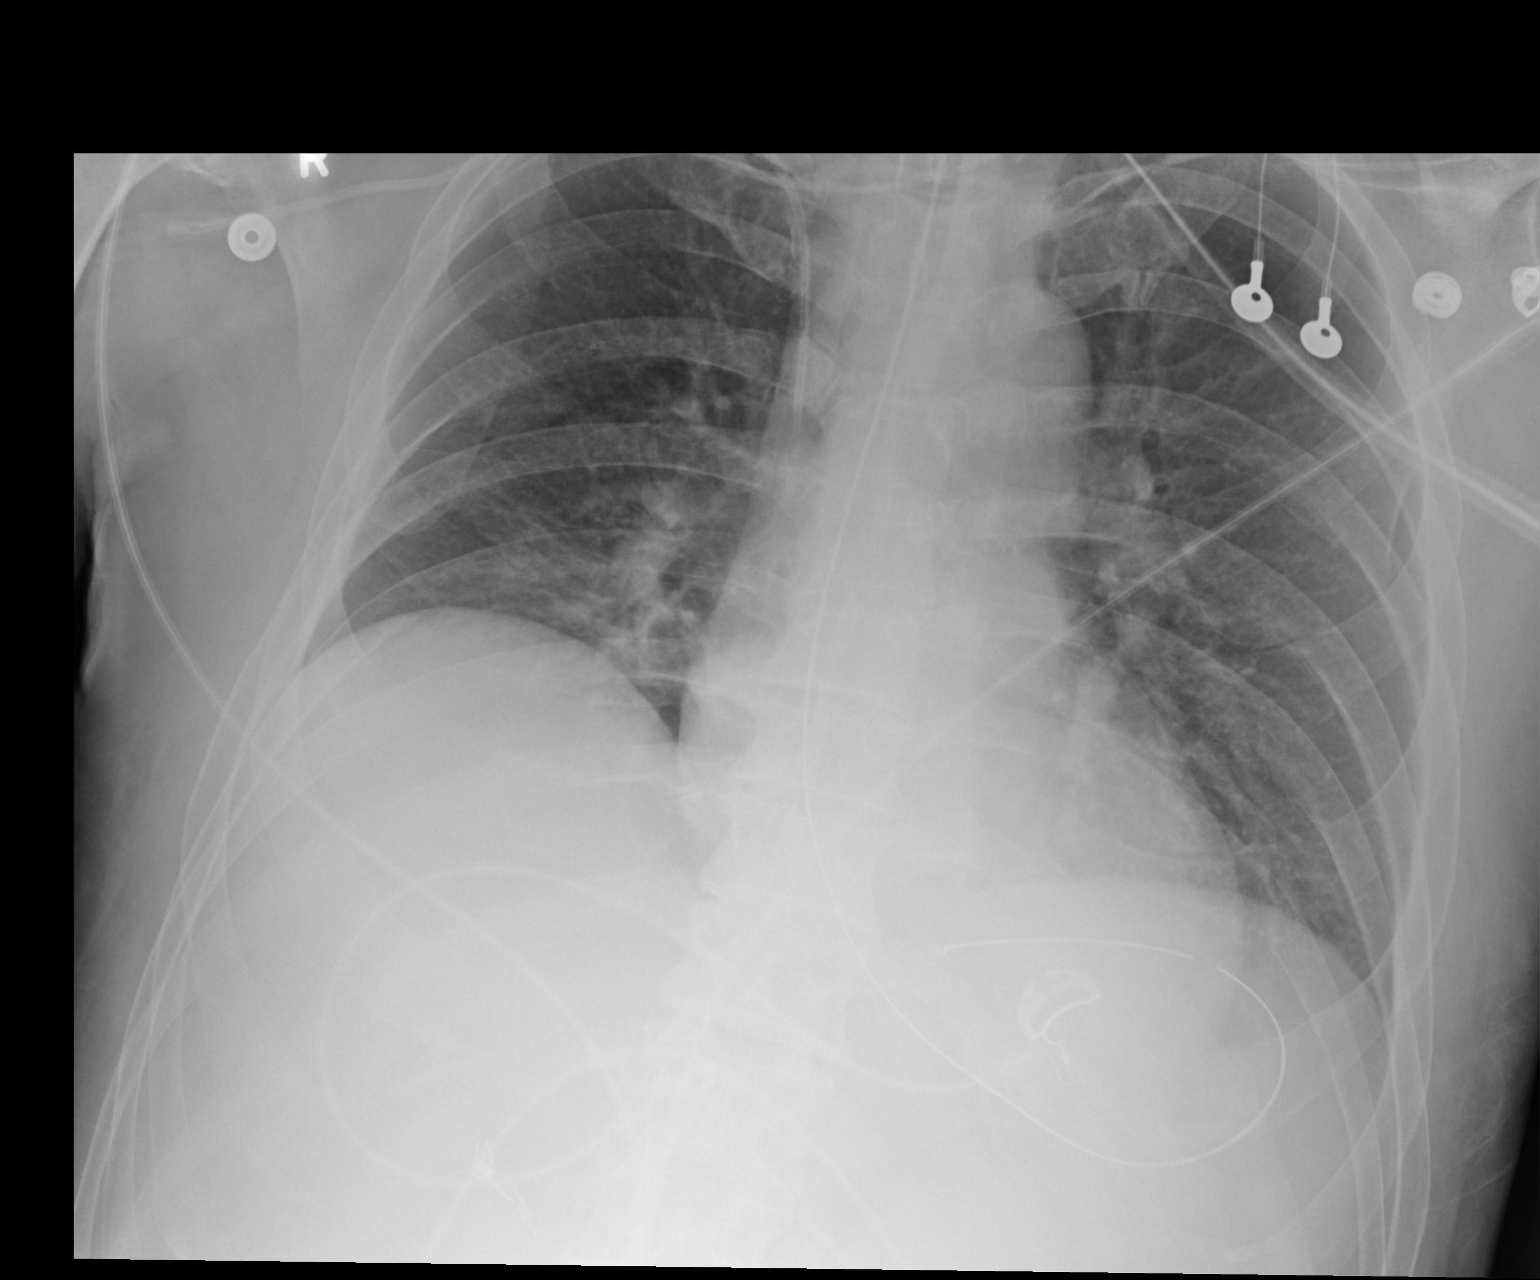

[AP (2 of 2)]
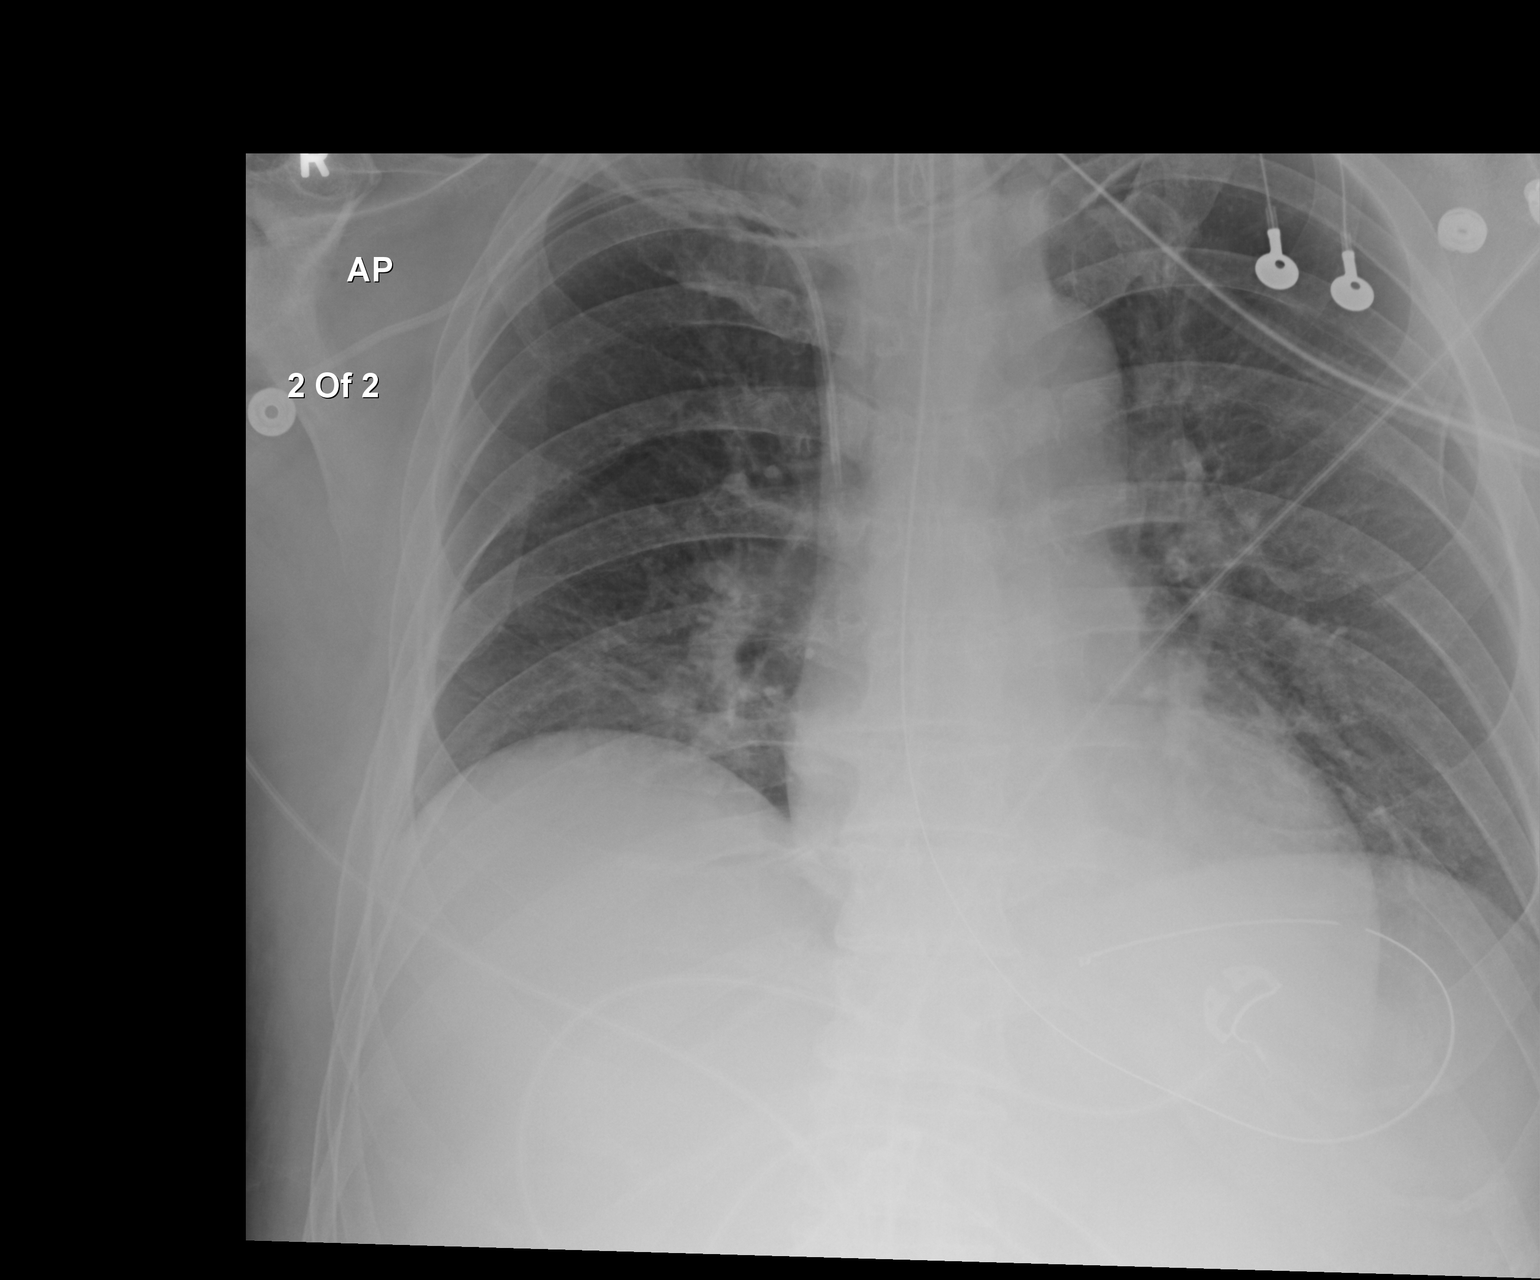

[2 of 2 positions shown; findings below may reference images not displayed]

FINDINGS: Endotracheal tube tip is 5.3 cm above the carina. Nasogastric tube
tip and side port are in the stomach. Central catheter tip is in the
superior vena cava. No pneumothorax. There is atelectasis in the
right base. Lungs elsewhere clear. Heart size and pulmonary
vascularity are normal. No adenopathy.
IMPRESSION: Tube and catheter positions as described without pneumothorax. Right
base atelectasis. Lungs elsewhere clear.
